# Patient Record
Sex: Female | Born: 1950 | Hispanic: No | Smoking: Never smoker
Health system: Southern US, Community
[De-identification: ages and names within clinical notes are randomized; demographics above are authoritative.]

## PROBLEM LIST (undated history)

## (undated) DIAGNOSIS — Z789 Other specified health status: Secondary | ICD-10-CM

## (undated) HISTORY — PX: BREAST SURGERY: SHX581

---

## 1977-02-09 HISTORY — PX: AUGMENTATION MAMMAPLASTY: SUR837

## 2015-12-26 DIAGNOSIS — M5441 Lumbago with sciatica, right side: Secondary | ICD-10-CM | POA: Diagnosis not present

## 2015-12-26 DIAGNOSIS — L299 Pruritus, unspecified: Secondary | ICD-10-CM | POA: Diagnosis not present

## 2015-12-26 DIAGNOSIS — M5136 Other intervertebral disc degeneration, lumbar region: Secondary | ICD-10-CM | POA: Diagnosis not present

## 2015-12-26 DIAGNOSIS — M545 Low back pain: Secondary | ICD-10-CM | POA: Diagnosis not present

## 2015-12-26 DIAGNOSIS — M25551 Pain in right hip: Secondary | ICD-10-CM | POA: Diagnosis not present

## 2015-12-26 DIAGNOSIS — G4700 Insomnia, unspecified: Secondary | ICD-10-CM | POA: Diagnosis not present

## 2015-12-26 DIAGNOSIS — R21 Rash and other nonspecific skin eruption: Secondary | ICD-10-CM | POA: Diagnosis not present

## 2015-12-26 DIAGNOSIS — E6609 Other obesity due to excess calories: Secondary | ICD-10-CM | POA: Diagnosis not present

## 2015-12-26 DIAGNOSIS — M1611 Unilateral primary osteoarthritis, right hip: Secondary | ICD-10-CM | POA: Diagnosis not present

## 2015-12-26 DIAGNOSIS — Z6831 Body mass index (BMI) 31.0-31.9, adult: Secondary | ICD-10-CM | POA: Diagnosis not present

## 2015-12-26 DIAGNOSIS — M47896 Other spondylosis, lumbar region: Secondary | ICD-10-CM | POA: Diagnosis not present

## 2015-12-26 DIAGNOSIS — R030 Elevated blood-pressure reading, without diagnosis of hypertension: Secondary | ICD-10-CM | POA: Diagnosis not present

## 2015-12-26 DIAGNOSIS — M47816 Spondylosis without myelopathy or radiculopathy, lumbar region: Secondary | ICD-10-CM | POA: Diagnosis not present

## 2015-12-26 DIAGNOSIS — M4316 Spondylolisthesis, lumbar region: Secondary | ICD-10-CM | POA: Diagnosis not present

## 2016-01-28 DIAGNOSIS — I10 Essential (primary) hypertension: Secondary | ICD-10-CM | POA: Diagnosis not present

## 2016-01-28 DIAGNOSIS — R21 Rash and other nonspecific skin eruption: Secondary | ICD-10-CM | POA: Diagnosis not present

## 2016-01-28 DIAGNOSIS — Z6832 Body mass index (BMI) 32.0-32.9, adult: Secondary | ICD-10-CM | POA: Diagnosis not present

## 2016-01-28 DIAGNOSIS — M5441 Lumbago with sciatica, right side: Secondary | ICD-10-CM | POA: Diagnosis not present

## 2016-01-28 DIAGNOSIS — M7061 Trochanteric bursitis, right hip: Secondary | ICD-10-CM | POA: Diagnosis not present

## 2016-01-28 DIAGNOSIS — E6609 Other obesity due to excess calories: Secondary | ICD-10-CM | POA: Diagnosis not present

## 2016-02-18 DIAGNOSIS — Z0000 Encounter for general adult medical examination without abnormal findings: Secondary | ICD-10-CM | POA: Diagnosis not present

## 2016-02-18 DIAGNOSIS — I10 Essential (primary) hypertension: Secondary | ICD-10-CM | POA: Diagnosis not present

## 2016-03-19 ENCOUNTER — Ambulatory Visit: Payer: Medicare Other

## 2016-03-19 ENCOUNTER — Encounter: Payer: Self-pay | Admitting: *Deleted

## 2016-03-19 ENCOUNTER — Ambulatory Visit
Admission: EM | Admit: 2016-03-19 | Discharge: 2016-03-19 | Disposition: A | Discharge status: Home / Self Care | Payer: Medicare Other | Attending: Family Medicine | Admitting: Family Medicine

## 2016-03-19 DIAGNOSIS — S99922A Unspecified injury of left foot, initial encounter: Secondary | ICD-10-CM | POA: Diagnosis not present

## 2016-03-19 DIAGNOSIS — S93692A Other sprain of left foot, initial encounter: Secondary | ICD-10-CM | POA: Insufficient documentation

## 2016-03-19 DIAGNOSIS — M79672 Pain in left foot: Secondary | ICD-10-CM | POA: Insufficient documentation

## 2016-03-19 DIAGNOSIS — S51812A Laceration without foreign body of left forearm, initial encounter: Secondary | ICD-10-CM

## 2016-03-19 DIAGNOSIS — S93602A Unspecified sprain of left foot, initial encounter: Secondary | ICD-10-CM | POA: Diagnosis not present

## 2016-03-19 MED ORDER — TETANUS-DIPHTH-ACELL PERTUSSIS 5-2.5-18.5 LF-MCG/0.5 IM SUSP: 0.5000 mL | Freq: Once | INTRAMUSCULAR | Status: DC

## 2016-03-19 MED ORDER — TETANUS-DIPHTH-ACELL PERTUSSIS 5-2.5-18.5 LF-MCG/0.5 IM SUSP
0.5000 mL | Freq: Once | INTRAMUSCULAR | Status: AC
  Administered 2016-03-19: 0.5 mL via INTRAMUSCULAR

## 2016-03-19 MED ORDER — MUPIROCIN 2 % EX OINT: 1.0000 "application " | TOPICAL_OINTMENT | Freq: Three times a day (TID) | CUTANEOUS | 0 refills | Status: DC
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
## 2016-04-22 DIAGNOSIS — M7061 Trochanteric bursitis, right hip: Secondary | ICD-10-CM | POA: Diagnosis not present

## 2016-06-02 DIAGNOSIS — M5136 Other intervertebral disc degeneration, lumbar region: Secondary | ICD-10-CM | POA: Diagnosis not present

## 2016-06-08 DIAGNOSIS — Z01812 Encounter for preprocedural laboratory examination: Secondary | ICD-10-CM | POA: Diagnosis not present

## 2016-06-09 DIAGNOSIS — M545 Low back pain: Secondary | ICD-10-CM | POA: Diagnosis not present

## 2016-06-10 DIAGNOSIS — M4316 Spondylolisthesis, lumbar region: Secondary | ICD-10-CM | POA: Diagnosis not present

## 2016-06-10 DIAGNOSIS — M4726 Other spondylosis with radiculopathy, lumbar region: Secondary | ICD-10-CM | POA: Diagnosis not present

## 2016-06-10 DIAGNOSIS — M5126 Other intervertebral disc displacement, lumbar region: Secondary | ICD-10-CM | POA: Diagnosis not present

## 2016-06-10 DIAGNOSIS — M47816 Spondylosis without myelopathy or radiculopathy, lumbar region: Secondary | ICD-10-CM | POA: Diagnosis not present

## 2017-03-24 ENCOUNTER — Other Ambulatory Visit: Payer: Self-pay

## 2017-03-24 ENCOUNTER — Ambulatory Visit: Payer: Medicare Other

## 2017-03-24 ENCOUNTER — Ambulatory Visit
Admission: EM | Admit: 2017-03-24 | Discharge: 2017-03-24 | Disposition: A | Discharge status: Home / Self Care | Payer: Medicare Other | Attending: Family Medicine | Admitting: Family Medicine

## 2017-03-24 ENCOUNTER — Encounter: Payer: Self-pay | Admitting: Emergency Medicine

## 2017-03-24 DIAGNOSIS — S61221A Laceration with foreign body of left index finger without damage to nail, initial encounter: Secondary | ICD-10-CM | POA: Diagnosis not present

## 2017-03-24 DIAGNOSIS — S61412A Laceration without foreign body of left hand, initial encounter: Secondary | ICD-10-CM | POA: Diagnosis not present

## 2017-03-24 DIAGNOSIS — W272XXA Contact with scissors, initial encounter: Secondary | ICD-10-CM

## 2017-03-24 HISTORY — DX: Other specified health status: Z78.9

## 2017-03-24 MED ORDER — CEPHALEXIN 500 MG PO CAPS: 500.0000 mg | ORAL_CAPSULE | Freq: Three times a day (TID) | ORAL | 0 refills | Status: AC

## 2017-03-24 MED ORDER — MUPIROCIN 2 % EX OINT: TOPICAL_OINTMENT | CUTANEOUS | 0 refills | Status: DC

## 2017-03-24 MED ORDER — LIDOCAINE HCL (PF) 1 % IJ SOLN: 10.0000 mL | Freq: Once | INTRAMUSCULAR | Status: DC

## 2017-03-24 NOTE — ED Triage Notes (Signed)
Patient was trying to cut a zip tie this morning and the scissors slipped and lacerated her left hand between thumb and pointer finger. Patient's last tetanus was 2017.

## 2017-03-24 NOTE — Discharge Instructions (Signed)
Take medication as prescribed. Rest. Drink plenty of fluids. Keep clean as discussed, and use splint.  Return to Urgent care in 10 days for suture removal. Return to Urgent care in 3 days for wound recheck.   Follow up with your primary care physician this week as needed. Return to Urgent care for new or worsening concerns.

## 2017-03-24 NOTE — ED Provider Notes (Signed)
[facility] ____________________________________________  Time seen: Approximately 8:49 AM  I have reviewed the triage vital signs and the nursing notes.   HISTORY  Chief Complaint Laceration (left hand)   HPI Angela Ponce is a 67 y.o. female presenting with husband at bedside for evaluation of left hand laceration that occurred just prior to arrival at home.  Patient reports that she was, trying to cut away a zip tie that was very tight, and reports in the process the scissors slipped and went to the thumb webbing of the left hand causing laceration.  Reports last tetanus immunization was 2 years ago.  Denies concern of foreign body.  Denies pain with movement.  States only mild pain directly at laceration site.  Denies head injury, loss of conscious or other complaints.  Reports right-hand dominant. Denies recent sickness. Denies recent antibiotic use.     Past Medical History:  Diagnosis Date  . No known health problems     There are no active problems to display for this patient.   History reviewed. No pertinent surgical history.    Current Facility-Administered Medications:  .  lidocaine (PF) (XYLOCAINE) 1 % injection 10 mL, 10 mL, Other, Once, [name], NP  Current Outpatient Medications:  .  cephALEXin (KEFLEX) 500 MG capsule, Take 1 capsule (500 mg total) by mouth 3 (three) times daily for 7 days., Disp: 21 capsule, Rfl: 0 .  mupirocin ointment (BACTROBAN) 2 %, Apply two times a day for 7 days., Disp: 22 g, Rfl: 0  Allergies Patient has no known allergies.  Family History  Problem Relation Age of Onset  . Bladder Cancer Mother   . Diabetes Mother   . Hypertension Mother     Social History Social History   Tobacco Use  . Smoking status: Never Smoker  . Smokeless tobacco: Never Used  Substance Use Topics  . Alcohol use: No  . Drug use: No    Review of Systems Constitutional: No fever/chills Cardiovascular: Denies chest  pain. Respiratory: Denies shortness of breath. Skin: As above.  ____________________________________________   PHYSICAL EXAM:  VITAL SIGNS: ED Triage Vitals  Enc Vitals Group     BP 03/24/17 0827 (!) 159/87     Pulse Rate 03/24/17 0827 72     Resp 03/24/17 0827 20     Temp 03/24/17 0827 97.8 F (36.6 C)     Temp Source 03/24/17 0827 Oral     SpO2 03/24/17 0827 100 %     Weight 03/24/17 0828 180 lb (81.6 kg)     Height 03/24/17 0828 5\' 5"  (1.651 m)     Head Circumference --      Peak Flow --      Pain Score 03/24/17 0828 3     Pain Loc --      Pain Edu? --      Excl. in [place]? --     Constitutional: Alert and oriented. Well appearing and in no acute distress. Cardiovascular: Normal rate, regular rhythm. Grossly normal heart sounds.  Good peripheral circulation. Respiratory: Normal respiratory effort without tachypnea nor retractions. Breath sounds are clear and equal bilaterally. No wheezes, rales, rhonchi. Musculoskeletal:  Steady gait. Neurologic:  Normal speech and language. Speech is normal. No gait instability.  Skin:  Skin is warm, dry.  Except: left hand laceration, see images.      6.5cm laceration left web spacing between 1 and 2 fingers, active continued bleeding without clear arterial site, no tendon or bone visualized, full  range of motion to left hand, no motor of tendon deficits noted, normal distal sensation and capillary refill to left hand fingers, no bony tenderness.  Psychiatric: Mood and affect are normal. Speech and behavior are normal. Patient exhibits appropriate insight and judgment   ___________________________________________   LABS (all labs ordered are listed, but only abnormal results are displayed)  Labs Reviewed - No data to display ____________________________________________   PROCEDURES Procedures   Procedure(s) performed:  Procedure explained and verbal consent obtained. Consent: Verbal consent obtained. Written consent not  obtained. Risks and benefits: risks, benefits and alternatives were discussed Patient identity confirmed: verbally with patient and hospital-assigned identification number  Consent given by: patient   Laceration Repair Location: left hand Length: 6.5 cm Foreign bodies: no foreign bodies Tendon involvement: none Nerve involvement: none Preparation: Patient was prepped and draped in the usual sterile fashion. Anesthesia with 1% Lidocaine 10 mls Irrigation solution: saline and betadine Irrigation method: jet lavage Amount of cleaning: copious Continued bleeding present and stopped with direct pressure and silver nitrate use. Repaired with 5-0 vicryl x 5, and 4-0 nylon x 11 (image above) Technique: simple interrupted  Approximation: loose Patient tolerate well. Wound well approximated post repair.  Antibiotic ointment and dressing applied.  Wound care instructions provided.  Observe for any signs of infection or other problems.    Radiology  EXAM: LEFT HAND - COMPLETE 3+ VIEW  COMPARISON:  None.  FINDINGS: No acute bony or joint abnormality is identified. Radiopaque density projecting in the web space between the thumb and index finger is likely related to suturing and bandaging. 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 Scattered mild degenerative change about the DIP joints is also noted.  IMPRESSION: Radiopaque density projecting in the webspace between the thumb and index finger is presumably related to treatment for laceration. No radiopaque foreign body is at its worse that no unexpected radiopaque foreign body is identified.  Negative for fracture.  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   Electronically Signed   By: Inge Rise M.D.   On: 03/24/2017 10:22  INITIAL IMPRESSION / ASSESSMENT AND PLAN / ED COURSE  Pertinent labs & imaging results that were available during my care of the patient were reviewed by  me and considered in my medical decision making (see chart for details).  Well-appearing patient.  No acute distress.  Large laceration to left hand.  No bony tenderness, full range of motion present, patient declined x-ray.  Laceration repaired as above.  After x-ray, patient reports some diffuse tenderness and request x-ray to be completed to ensure no fracture.  Left hand x-ray completed as above.  Ulnar gutter splint Velcro also given the next 2-3 days for support.  Encouraged rest, elevation, supportive care.  Will place patient on Keflex and topical Bactroban.  Encouraged wound check in 3 days.  Also suture removal in 10 days.  Discussed strict follow-up and return parameters.Discussed indication, risks and benefits of medications with patient.   Discussed follow up and return parameters including no resolution or any worsening concerns. Patient verbalized understanding and agreed to plan.   ____________________________________________   FINAL CLINICAL IMPRESSION(S) / ED DIAGNOSES  Final diagnoses:  Laceration of left hand without foreign body, initial encounter     ED Discharge Orders        Ordered    cephALEXin (KEFLEX) 500 MG capsule  3 times daily     03/24/17 1034    mupirocin ointment (BACTROBAN) 2 %  03/24/17 1034       Note: This dictation was prepared with Dragon dictation along with smaller phrase technology. Any transcriptional errors that result from this process are unintentional.         [name], NP 03/24/17 1145

## 2017-04-05 ENCOUNTER — Other Ambulatory Visit: Payer: Self-pay

## 2017-04-05 ENCOUNTER — Ambulatory Visit
Admission: EM | Admit: 2017-04-05 | Discharge: 2017-04-05 | Disposition: A | Discharge status: Home / Self Care | Payer: Medicare Other | Attending: Family Medicine | Admitting: Family Medicine

## 2017-04-05 ENCOUNTER — Encounter: Payer: Self-pay | Admitting: Emergency Medicine

## 2017-04-05 DIAGNOSIS — S61412D Laceration without foreign body of left hand, subsequent encounter: Secondary | ICD-10-CM | POA: Diagnosis not present

## 2017-04-05 DIAGNOSIS — Z5189 Encounter for other specified aftercare: Secondary | ICD-10-CM

## 2017-04-05 MED ORDER — CEPHALEXIN 500 MG PO CAPS: 500.0000 mg | ORAL_CAPSULE | Freq: Three times a day (TID) | ORAL | 0 refills | Status: AC

## 2017-04-05 NOTE — ED Provider Notes (Addendum)
[facility] ____________________________________________  Time seen: Approximately 1:33 PM  I have reviewed the triage vital signs and the nursing notes.   HISTORY  Chief Complaint Suture / Staple Removal   HPI Angela Ponce is a 67 y.o. female presenting for wound check as well as evaluation for suture removal.  Patient was seen in urgent care 12 days ago for laceration present to spacing between the first and second fingers of left hand post accidentally cutting self with scissors.  Patient had sustained a deep wound that required subcutaneous as well as external sutures and cautery to stop bleeding.  Patient reports that she feels that the area is healing well, but unsure if sutures should be taken at this time.  States occasionally she notices a small amount of clearish to brownish drainage after using her hand a lot, but otherwise denies drainage.  Denies any associated pain to the hand, paresthesias, decreased strength or decreased range of motion. Denies bleeding at home.  Has continue to rest the area.  Finished her antibiotic last week and has since stopped applying topical antibiotics as well.  Right-hand-dominant.  Reports otherwise feels well denies other complaints.  No fevers.  Tetanus immunization was up-to-date.  Past Medical History:  Diagnosis Date  . No known health problems     There are no active problems to display for this patient.   History reviewed. No pertinent surgical history.   No current facility-administered medications for this encounter.   Current Outpatient Medications:  .  cephALEXin (KEFLEX) 500 MG capsule, Take 1 capsule (500 mg total) by mouth 3 (three) times daily for 7 days., Disp: 21 capsule, Rfl: 0 .  mupirocin ointment (BACTROBAN) 2 %, Apply two times a day for 7 days., Disp: 22 g, Rfl: 0  Allergies Patient has no known allergies.  Family History  Problem Relation Age of Onset  . Bladder Cancer Mother   . Diabetes Mother    . Hypertension Mother     Social History Social History   Tobacco Use  . Smoking status: Never Smoker  . Smokeless tobacco: Never Used  Substance Use Topics  . Alcohol use: No  . Drug use: No    Review of Systems Constitutional: No fever/chills Cardiovascular: Denies chest pain. Respiratory: Denies shortness of breath. Skin:As above.  ____________________________________________   PHYSICAL EXAM:  VITAL SIGNS: ED Triage Vitals  Enc Vitals Group     BP 04/05/17 1253 (!) 159/79     Pulse Rate 04/05/17 1253 77     Resp 04/05/17 1253 18     Temp 04/05/17 1253 (!) 97.5 F (36.4 C)     Temp Source 04/05/17 1253 Oral     SpO2 04/05/17 1257 100 %     Weight 04/05/17 1257 180 lb (81.6 kg)     Height 04/05/17 1257 5\' 5"  (1.651 m)     Head Circumference --      Peak Flow --      Pain Score 04/05/17 1256 0     Pain Loc --      Pain Edu? --      Excl. in [place]? --     Constitutional: Alert and oriented. Well appearing and in no acute distress. Cardiovascular: Normal rate, regular rhythm. Grossly normal heart sounds.  Good peripheral circulation. Respiratory: Normal respiratory effort without tachypnea nor retractions. Breath sounds are clear and equal bilaterally. No wheezes, rales, rhonchi. Musculoskeletal: Steady gait.  Left hand nontender and with full range of motion present,  no motor or tendon deficit noted, normal distal sensation and capillary refill. Neurologic:  Normal speech and language. No gross focal neurologic deficits are appreciated. Speech is normal. No gait instability.  Skin:  Skin is warm, dry.  Except: Left hand mid thumb and second finger web spacing healing laceration present with times 11 sutures present, minimal amount of clear to yellowish fluid expressed with palpation, sutures examined for adherent approximation and with slight dehiscence noted at proximal and distal laceration site with resisted traction, otherwise no dehiscence, no surrounding  erythema, swelling, tenderness. Psychiatric: Mood and affect are normal. Speech and behavior are normal. Patient exhibits appropriate insight and judgment   ___________________________________________   LABS (all labs ordered are listed, but only abnormal results are displayed)  Labs Reviewed - No data to display ____________________________________________  PROCEDURES Procedures   INITIAL IMPRESSION / ASSESSMENT AND PLAN / ED COURSE  Pertinent labs & imaging results that were available during my care of the patient were reviewed by me and considered in my medical decision making (see chart for details).  Well-appearing patient.  No acute distress.  Wound recheck and evaluation for suture removal.  The patient has sustained a deep wound that appears to be healing well overall.  Suspect drainage related to inflammatory overuse, however some concern for secondary infection and will restart cephalexin.  Discussed in detail that superficial dehiscence may occur, however we discussed leave the sutures in for 4 more days while starting antibiotic and resuming topical antibiotic and return for suture removal.  Discussed may need to use Steri-Strips for support for further healing.  Patient agreed to this plan.Discussed indication, risks and benefits of medications with patient.  Discussed follow up and return parameters including no resolution or any worsening concerns. Patient verbalized understanding and agreed to plan.   ____________________________________________   FINAL CLINICAL IMPRESSION(S) / ED DIAGNOSES  Final diagnoses:  Visit for wound check     ED Discharge Orders        Ordered    cephALEXin (KEFLEX) 500 MG capsule  3 times daily     04/05/17 1334       Note: This dictation was prepared with Dragon dictation along with smaller phrase technology. Any transcriptional errors that result from this process are unintentional.         [name], NP 04/05/17  [phone]

## 2017-04-05 NOTE — Discharge Instructions (Signed)
Take medication as prescribed. Wound care discussed. Return in 4 days for wound recheck and suture removal.  Follow up with your primary care physician this week as needed. Return to Urgent care for new or worsening concerns.

## 2017-04-05 NOTE — ED Triage Notes (Signed)
Patient here for suture removal. Patient states she is having some drainage from the wound and would like a provider to assess it prior to removal of sutures.

## 2017-04-09 ENCOUNTER — Other Ambulatory Visit: Payer: Self-pay

## 2017-04-09 ENCOUNTER — Ambulatory Visit
Admission: EM | Admit: 2017-04-09 | Discharge: 2017-04-09 | Disposition: A | Discharge status: Home / Self Care | Payer: Medicare Other | Attending: Family Medicine | Admitting: Family Medicine

## 2017-04-09 ENCOUNTER — Encounter: Payer: Self-pay | Admitting: Emergency Medicine

## 2017-04-09 DIAGNOSIS — T8130XA Disruption of wound, unspecified, initial encounter: Secondary | ICD-10-CM

## 2017-04-09 DIAGNOSIS — S61412D Laceration without foreign body of left hand, subsequent encounter: Secondary | ICD-10-CM

## 2017-04-09 DIAGNOSIS — Z4802 Encounter for removal of sutures: Secondary | ICD-10-CM | POA: Diagnosis not present

## 2017-04-09 NOTE — ED Triage Notes (Signed)
Patient in today for suture removal. Laceration is clean, dry and well healed.

## 2017-04-09 NOTE — Discharge Instructions (Signed)
Take medication as prescribed. Keep clean.  ° °Follow up with your primary care physician this week as needed. Return to Urgent care for new or worsening concerns.  ° °

## 2017-04-09 NOTE — ED Provider Notes (Signed)
[facility] ____________________________________________  Time seen: Approximately 1150 PM  I have reviewed the triage vital signs and the nursing notes.   HISTORY  Chief Complaint Suture / Staple Removal (APPT)   HPI Angela Ponce is a 67 y.o. female presenting for reevaluation of left hand sutures.  Patient was initially seen in urgent care on 03/24/17 after sustaining a laceration to space in between left thumb and left index finger that required extensive repair, and then returned on 225 for suture removal.  It was found that area needed longer due to concern of dehiscence as well as possible concern of secondary infection and was restarted on oral Keflex.  Patient states that she has been taking oral Keflex as well as topical Bactroban as prescribed.  States the area feels like it is continued to improve to her.  States no pain associated to laceration area.  Denies any recent drainage, paresthesias, pain radiation, decreased strength, decreased movement or fevers.  Reports doing well.  Presenting for suture removal and wound check.  Tetanus immunization is up-to-date.    Past Medical History:  Diagnosis Date  . No known health problems     There are no active problems to display for this patient.   History reviewed. No pertinent surgical history.   No current facility-administered medications for this encounter.   Current Outpatient Medications:  .  cephALEXin (KEFLEX) 500 MG capsule, Take 1 capsule (500 mg total) by mouth 3 (three) times daily for 7 days., Disp: 21 capsule, Rfl: 0 .  mupirocin ointment (BACTROBAN) 2 %, Apply two times a day for 7 days., Disp: 22 g, Rfl: 0  Allergies Patient has no known allergies.  Family History  Problem Relation Age of Onset  . Bladder Cancer Mother   . Diabetes Mother   . Hypertension Mother     Social History Social History   Tobacco Use  . Smoking status: Never Smoker  . Smokeless tobacco: Never Used    Substance Use Topics  . Alcohol use: No  . Drug use: No    Review of Systems Constitutional: No fever/chills Cardiovascular: Denies chest pain. Respiratory: Denies shortness of breath. Gastrointestinal: No abdominal pain. Musculoskeletal: Negative for back pain. Skin: As above.    ____________________________________________   PHYSICAL EXAM:  VITAL SIGNS: ED Triage Vitals  Enc Vitals Group     BP 04/09/17 0951 (!) 148/79     Pulse Rate 04/09/17 0951 71     Resp 04/09/17 0951 16     Temp 04/09/17 0951 97.9 F (36.6 C)     Temp Source 04/09/17 0951 Oral     SpO2 04/09/17 0951 99 %     Weight 04/09/17 0952 180 lb (81.6 kg)     Height 04/09/17 0952 5\' 5"  (1.651 m)     Head Circumference --      Peak Flow --      Pain Score 04/09/17 0951 0     Pain Loc --      Pain Edu? --      Excl. in [place]? --     Constitutional: Alert and oriented. Well appearing and in no acute distress. Cardiovascular: Normal rate, regular rhythm. Grossly normal heart sounds.  Good peripheral circulation. Respiratory: Normal respiratory effort without tachypnea nor retractions. Breath sounds are clear and equal bilaterally. No wheezes, rales, rhonchi. Musculoskeletal: Steady gait.  Neurologic:  Normal speech and language. No gross focal neurologic deficits are appreciated. Speech is normal. No gait instability.  Skin:  Skin is warm, dry. Except: Left hand webspace between first and second fingers times 11 sutures present, no surrounding erythema, nontender, no drainage, mild appearance of some dehiscence at suture sites, left hand nontender with no motor or tendon deficits, full range of motion present with good strength. Psychiatric: Mood and affect are normal. Speech and behavior are normal. Patient exhibits appropriate insight and judgment   ___________________________________________   LABS (all labs ordered are listed, but only abnormal results are displayed)  Labs Reviewed - No data to  display   PROCEDURES Procedures   Procedure(s) performed:  Procedure explained and verbal consent obtained. Consent: Verbal consent obtained. Written consent not obtained. Risks and benefits: risks, benefits and alternatives were discussed Patient identity confirmed: verbally with patient and hospital-assigned identification number  Consent given by: patient   Suture removal Location: Left hand 11 sutures removed by myself.  There is noted to be mild proximal dehiscence, no drainage, no erythema, no appearance of secondary infection.  As sutures is been present for 2 weeks recommend for suture removal.  All external sutures removed.  Skin cleaned and prepped, x2 Steri-Strips applied for supportive care.  Remaining Steri-Strips sent home with patient and directed in care at home as well as wound care. Wound care instructions provided.  Observe for any signs of infection or other problems.      INITIAL IMPRESSION / ASSESSMENT AND PLAN / ED COURSE  Pertinent labs & imaging results that were available during my care of the patient were reviewed by me and considered in my medical decision making (see chart for details).  Well-appearing patient.  No acute distress.  Presenting for evaluation of wound check as well as suture removal.  Noted wound likely still do his prior to suture removal, but due to timing of sutures being present recommend for suture removal.  Mild dehiscence noted, Steri-Strips applied for support.  Directed to continue with Steri-Strips at home.  Continue home antibiotic therapy and discussed strict follow-up and return parameters.  Discussed follow up with Primary care physician this week. Discussed follow up and return parameters including no resolution or any worsening concerns. Patient verbalized understanding and agreed to plan.   ____________________________________________   FINAL CLINICAL IMPRESSION(S) / ED DIAGNOSES  Final diagnoses:  Visit for suture  removal  Dehiscence of laceration wound, initial encounter     ED Discharge Orders    None       Note: This dictation was prepared with Dragon dictation along with smaller phrase technology. Any transcriptional errors that result from this process are unintentional.         [name], NP 04/09/17 1300

## 2017-04-13 ENCOUNTER — Encounter: Payer: Self-pay | Admitting: *Deleted

## 2017-04-13 ENCOUNTER — Ambulatory Visit
Admission: EM | Admit: 2017-04-13 | Discharge: 2017-04-13 | Disposition: A | Discharge status: Home / Self Care | Payer: Medicare Other | Attending: Family Medicine | Admitting: Family Medicine

## 2017-04-13 DIAGNOSIS — Z5189 Encounter for other specified aftercare: Secondary | ICD-10-CM

## 2017-04-13 DIAGNOSIS — S61412D Laceration without foreign body of left hand, subsequent encounter: Secondary | ICD-10-CM

## 2017-04-13 DIAGNOSIS — T148XXA Other injury of unspecified body region, initial encounter: Secondary | ICD-10-CM

## 2017-04-13 NOTE — Discharge Instructions (Signed)
Keep clean as discussed. Rest.   Follow up with your primary care physician this week as needed. Return to Urgent care for new or worsening concerns.

## 2017-04-13 NOTE — ED Provider Notes (Signed)
[facility] ____________________________________________  Time seen: Approximately 175 PM  I have reviewed the triage vital signs and the nursing notes.   HISTORY  Chief Complaint Wound Check   HPI Angela Ponce is a 67 y.o. female patient presenting today again for left hand wound check after recent laceration and suture removal.  Patient reports Steri-Strips that were placed at last visit were helping, but reports she drove the car the other day and felt like this because the Steri-Strips to come off.  Patient denies any pain, paresthesias, decreased strength, decreased range of motion, fevers, redness or other complaints.  Patient states she just wanted to have the area rechecked to make sure it was okay.  Denies complaints.  States finished her antibiotic oral today.  Continues to keep clean.   Past Medical History:  Diagnosis Date  . No known health problems     There are no active problems to display for this patient.   History reviewed. No pertinent surgical history.   No current facility-administered medications for this encounter.   Current Outpatient Medications:  .  mupirocin ointment (BACTROBAN) 2 %, Apply two times a day for 7 days., Disp: 22 g, Rfl: 0  Allergies Patient has no known allergies.  Family History  Problem Relation Age of Onset  . Bladder Cancer Mother   . Diabetes Mother   . Hypertension Mother     Social History Social History   Tobacco Use  . Smoking status: Never Smoker  . Smokeless tobacco: Never Used  Substance Use Topics  . Alcohol use: No  . Drug use: No    Review of Systems Constitutional: No fever/chills Cardiovascular: Denies chest pain. Respiratory: Denies shortness of breath. Skin: AS above.  ____________________________________________   PHYSICAL EXAM:  VITAL SIGNS: ED Triage Vitals  Enc Vitals Group     BP 04/13/17 1616 (!) 158/78     Pulse Rate 04/13/17 1616 83     Resp 04/13/17 1616 16   Temp 04/13/17 1616 97.6 F (36.4 C)     Temp Source 04/13/17 1616 Oral     SpO2 04/13/17 1616 99 %     Weight 04/13/17 1614 180 lb (81.6 kg)     Height 04/13/17 1614 5\' 5"  (1.651 m)     Head Circumference --      Peak Flow --      Pain Score 04/13/17 1614 0     Pain Loc --      Pain Edu? --      Excl. in [place]? --     Constitutional: Alert and oriented. Well appearing and in no acute distress. Cardiovascular:  Good peripheral circulation. Respiratory: Normal respiratory effort without tachypnea nor retractions.  Musculoskeletal:Bilateral distal radial pulses equal and easily palpated.  Bilateral hand grip strong and equal.  See skin below. Neurologic:  Normal speech and language.  Speech is normal. No gait instability.  Skin:  Skin is warm, dry .  Except:   Noted dehiscence as above at recent laceration repair site.  Wound and hand nontender with good strength, no motor or tendon deficit, no paresthesias, normal distal sensation and capillary refill.  Wound no surrounding erythema, drainage, mild induration. Psychiatric: Mood and affect are normal. Speech and behavior are normal. Patient exhibits appropriate insight and judgment   ___________________________________________   LABS (all labs ordered are listed, but only abnormal results are displayed)  Labs Reviewed - No data to display  PROCEDURES Procedures  Procedure explained and verbal consent obtained  for wound cleaning and Steri-Strip. Left hand wound clean Betadine and saline.  Patient tolerated well.  X2 Steri-Strips applied and wound well approximated.  Dressing applied.  INITIAL IMPRESSION / ASSESSMENT AND PLAN / ED COURSE  Pertinent labs & imaging results that were available during my care of the patient were reviewed by me and considered in my medical decision making (see chart for details).  Well-appearing patient.  No acute distress.  Follow-up for wound check to left hand.  Area does not appear to have a  secondary infection, noted to have dehiscence with thickened scabbing.  Discussed continued use of Steri-Strips, clean and supportive care.  Discussed strict follow-up and return parameters.  Discussed follow up and return parameters including no resolution or any worsening concerns. Patient verbalized understanding and agreed to plan.   ____________________________________________   FINAL CLINICAL IMPRESSION(S) / ED DIAGNOSES  Final diagnoses:  Visit for wound check     ED Discharge Orders    None       Note: This dictation was prepared with Dragon dictation along with smaller phrase technology. Any transcriptional errors that result from this process are unintentional.         [name], NP 04/13/17 Vernelle Emerald

## 2017-04-13 NOTE — ED Triage Notes (Signed)
Pt is here for a follow up visit. Left hand wound. Pt had stiches removed last Friday, but was told to come see [place] today for a follow up

## 2017-06-23 DIAGNOSIS — D239 Other benign neoplasm of skin, unspecified: Secondary | ICD-10-CM | POA: Diagnosis not present

## 2017-06-23 DIAGNOSIS — L280 Lichen simplex chronicus: Secondary | ICD-10-CM | POA: Diagnosis not present

## 2018-04-15 ENCOUNTER — Ambulatory Visit: Payer: Medicare Other

## 2018-04-15 ENCOUNTER — Other Ambulatory Visit: Payer: Self-pay

## 2018-04-15 ENCOUNTER — Ambulatory Visit
Admission: EM | Admit: 2018-04-15 | Discharge: 2018-04-15 | Disposition: A | Discharge status: Home / Self Care | Payer: Medicare Other | Attending: Family Medicine | Admitting: Family Medicine

## 2018-04-15 ENCOUNTER — Encounter: Payer: Self-pay | Admitting: Emergency Medicine

## 2018-04-15 DIAGNOSIS — J069 Acute upper respiratory infection, unspecified: Secondary | ICD-10-CM | POA: Diagnosis not present

## 2018-04-15 DIAGNOSIS — R05 Cough: Secondary | ICD-10-CM | POA: Diagnosis not present

## 2018-04-15 DIAGNOSIS — B9789 Other viral agents as the cause of diseases classified elsewhere: Secondary | ICD-10-CM | POA: Diagnosis not present

## 2018-04-15 DIAGNOSIS — M7671 Peroneal tendinitis, right leg: Secondary | ICD-10-CM | POA: Diagnosis not present

## 2018-04-15 DIAGNOSIS — M79671 Pain in right foot: Secondary | ICD-10-CM | POA: Diagnosis not present

## 2018-04-15 DIAGNOSIS — M7731 Calcaneal spur, right foot: Secondary | ICD-10-CM | POA: Diagnosis not present

## 2018-04-15 DIAGNOSIS — R062 Wheezing: Secondary | ICD-10-CM | POA: Diagnosis not present

## 2018-04-15 DIAGNOSIS — M722 Plantar fascial fibromatosis: Secondary | ICD-10-CM | POA: Diagnosis not present

## 2018-04-15 LAB — RAPID INFLUENZA A&B ANTIGENS: Influenza B (ARMC): NEGATIVE

## 2018-04-15 LAB — RAPID INFLUENZA A&B ANTIGENS (ARMC ONLY): INFLUENZA A (ARMC): NEGATIVE

## 2018-04-15 MED ORDER — BENZONATATE 200 MG PO CAPS: 200.0000 mg | ORAL_CAPSULE | Freq: Three times a day (TID) | ORAL | 0 refills | Status: DC | PRN

## 2018-04-15 MED ORDER — ALBUTEROL SULFATE HFA 108 (90 BASE) MCG/ACT IN AERS: 1.0000 | INHALATION_SPRAY | Freq: Four times a day (QID) | RESPIRATORY_TRACT | 0 refills | Status: DC | PRN

## 2018-04-15 NOTE — Discharge Instructions (Signed)
Rest, fluids, over the counter cold/cough medication as needed

## 2018-04-15 NOTE — ED Triage Notes (Signed)
Patient c/o cough and SOB that started on Wed.  Patient states that she was 2 weeks in [place], [place] and arrived back from [place] on Tuesday night.  Patient states that she has not been around anyone sick.  Patient denies fevers.

## 2018-04-15 NOTE — ED Provider Notes (Signed)
[facility]    CSN: [id number] Arrival date & time: 04/15/18  1051     History   Chief Complaint Chief Complaint  Patient presents with  . Cough    HPI Angela Ponce is a 68 y.o. female.   68 yo otherwise healthy female presents with a c/o cough and shortness of breath for the past 2 days. Denies any fevers, chills, chest pains. States she was in [place] for 2 weeks and arrived back on Tuesday (4 days ago). Denies any known exposure to coronavirus affected or positive persons. States her brother who was also there with her was recently diagnosed with pneumonia.   The history is provided by the patient.  Cough    Past Medical History:  Diagnosis Date  . No known health problems     There are no active problems to display for this patient.   History reviewed. No pertinent surgical history.  OB History   No obstetric history on file.      Home Medications    Prior to Admission medications   Medication Sig Start Date End Date Taking? Authorizing Provider  albuterol (PROVENTIL HFA;VENTOLIN HFA) 108 (90 Base) MCG/ACT inhaler Inhale 1-2 puffs into the lungs every 6 (six) hours as needed for wheezing or shortness of breath. 04/15/18   Norval Gable, MD  benzonatate (TESSALON) 200 MG capsule Take 1 capsule (200 mg total) by mouth 3 (three) times daily as needed. 04/15/18   Norval Gable, MD  mupirocin ointment (BACTROBAN) 2 % Apply two times a day for 7 days. 03/24/17   [name], NP    Family History Family History  Problem Relation Age of Onset  . Bladder Cancer Mother   . Diabetes Mother   . Hypertension Mother     Social History Social History   Tobacco Use  . Smoking status: Never Smoker  . Smokeless tobacco: Never Used  Substance Use Topics  . Alcohol use: No  . Drug use: No     Allergies   Patient has no known allergies.   Review of Systems Review of Systems  Respiratory: Positive for cough.      Physical Exam Triage Vital  Signs ED Triage Vitals  Enc Vitals Group     BP 04/15/18 1106 (!) 167/88     Pulse Rate 04/15/18 1106 79     Resp 04/15/18 1106 16     Temp 04/15/18 1106 97.8 F (36.6 C)     Temp Source 04/15/18 1106 Oral     SpO2 04/15/18 1106 97 %     Weight 04/15/18 1103 185 lb (83.9 kg)     Height 04/15/18 1103 5' 5.5" (1.664 m)     Head Circumference --      Peak Flow --      Pain Score 04/15/18 1103 0     Pain Loc --      Pain Edu? --      Excl. in [place]? --    No data found.  Updated Vital Signs BP (!) 167/88 (BP Location: Left Arm)   Pulse 79   Temp 97.8 F (36.6 C) (Oral)   Resp 16   Ht 5' 5.5" (1.664 m)   Wt 83.9 kg   SpO2 97%   BMI 30.32 kg/m   Visual Acuity Right Eye Distance:   Left Eye Distance:   Bilateral Distance:    Right Eye Near:   Left Eye Near:    Bilateral Near:  Physical Exam Vitals signs and nursing note reviewed.  Constitutional:      General: She is not in acute distress.    Appearance: She is not ill-appearing or toxic-appearing.  HENT:     Nose: Congestion and rhinorrhea present.  Eyes:     General:        Right eye: No discharge.        Left eye: No discharge.  Cardiovascular:     Rate and Rhythm: Normal rate and regular rhythm.     Pulses: Normal pulses.  Pulmonary:     Effort: Pulmonary effort is normal. No respiratory distress.     Breath sounds: No stridor. Wheezing and rhonchi present. No rales.  Chest:     Chest wall: Tenderness: right side.  Neurological:     Mental Status: She is alert.      UC Treatments / Results  Labs (all labs ordered are listed, but only abnormal results are displayed) Labs Reviewed  RAPID INFLUENZA A&B ANTIGENS ([place])    EKG None  Radiology Dg Chest 2 View  Result Date: 04/15/2018 CLINICAL DATA:  Upper chest tightness and wheezing with nonproductive cough. Recent travel to [facility]. EXAM: CHEST - 2 VIEW COMPARISON:  None. FINDINGS: Cardiomediastinal silhouette is normal.  Mediastinal contours appear intact. There is no evidence of focal airspace consolidation, pleural effusion or pneumothorax. Osseous structures are without acute abnormality. Soft tissues are grossly normal. IMPRESSION: No active cardiopulmonary disease. Electronically Signed   By: Fidela Salisbury M.D.   On: 04/15/2018 12:16    Procedures Procedures (including critical care time)  Medications Ordered in UC Medications - No data to display  Initial Impression / Assessment and Plan / UC Course  I have reviewed the triage vital signs and the nursing notes.  Pertinent labs & imaging results that were available during my care of the patient were reviewed by me and considered in my medical decision making (see chart for details).      Final Clinical Impressions(s) / UC Diagnoses   Final diagnoses:  Viral URI with cough  Wheezing     Discharge Instructions     Rest, fluids, over the counter cold/cough medication as needed    ED Prescriptions    Medication Sig Dispense Auth. Provider   albuterol (PROVENTIL HFA;VENTOLIN HFA) 108 (90 Base) MCG/ACT inhaler Inhale 1-2 puffs into the lungs every 6 (six) hours as needed for wheezing or shortness of breath. 1 Inhaler Kayloni Rocco, Linward Foster, MD   benzonatate (TESSALON) 200 MG capsule Take 1 capsule (200 mg total) by mouth 3 (three) times daily as needed. 30 capsule Norval Gable, MD     1. Labs/x-ray results and diagnosis reviewed with patient 2. rx as per orders above; reviewed possible side effects, interactions, risks and benefits  3. Recommend supportive treatment as above 4. Follow-up prn if symptoms worsen or don't improve    Controlled Substance Prescriptions [place] Controlled Substance Registry consulted? Not Applicable   Norval Gable, MD 04/15/18 [id number]

## 2018-05-06 DIAGNOSIS — M7751 Other enthesopathy of right foot: Secondary | ICD-10-CM | POA: Diagnosis not present

## 2018-05-06 DIAGNOSIS — M722 Plantar fascial fibromatosis: Secondary | ICD-10-CM | POA: Diagnosis not present

## 2018-05-19 ENCOUNTER — Ambulatory Visit: Payer: Self-pay | Admitting: Family Medicine

## 2018-08-16 ENCOUNTER — Encounter

## 2018-08-16 ENCOUNTER — Other Ambulatory Visit: Payer: Self-pay

## 2018-08-16 ENCOUNTER — Telehealth: Payer: Self-pay

## 2018-08-16 ENCOUNTER — Ambulatory Visit (INDEPENDENT_AMBULATORY_CARE_PROVIDER_SITE_OTHER): Payer: Medicare Other | Admitting: Family Medicine

## 2018-08-16 ENCOUNTER — Encounter: Payer: Self-pay | Admitting: Family Medicine

## 2018-08-16 VITALS — BP 132/84 | HR 72 | Temp 97.1°F | Resp 16 | Ht 65.0 in | Wt 188.6 lb

## 2018-08-16 DIAGNOSIS — Z1212 Encounter for screening for malignant neoplasm of rectum: Secondary | ICD-10-CM | POA: Diagnosis not present

## 2018-08-16 DIAGNOSIS — Z1211 Encounter for screening for malignant neoplasm of colon: Secondary | ICD-10-CM

## 2018-08-16 DIAGNOSIS — Z114 Encounter for screening for human immunodeficiency virus [HIV]: Secondary | ICD-10-CM | POA: Diagnosis not present

## 2018-08-16 DIAGNOSIS — Z1159 Encounter for screening for other viral diseases: Secondary | ICD-10-CM

## 2018-08-16 DIAGNOSIS — Z13220 Encounter for screening for lipoid disorders: Secondary | ICD-10-CM | POA: Diagnosis not present

## 2018-08-16 DIAGNOSIS — Z13820 Encounter for screening for osteoporosis: Secondary | ICD-10-CM | POA: Diagnosis not present

## 2018-08-16 DIAGNOSIS — Z1231 Encounter for screening mammogram for malignant neoplasm of breast: Secondary | ICD-10-CM | POA: Diagnosis not present

## 2018-08-16 DIAGNOSIS — E6609 Other obesity due to excess calories: Secondary | ICD-10-CM | POA: Diagnosis not present

## 2018-08-16 DIAGNOSIS — Z6831 Body mass index (BMI) 31.0-31.9, adult: Secondary | ICD-10-CM | POA: Diagnosis not present

## 2018-08-16 DIAGNOSIS — Z7689 Persons encountering health services in other specified circumstances: Secondary | ICD-10-CM

## 2018-08-16 MED ORDER — NA SULFATE-K SULFATE-MG SULF 17.5-3.13-1.6 GM/177ML PO SOLN: 1.0000 | Freq: Once | ORAL | 0 refills | Status: AC

## 2018-08-16 NOTE — Progress Notes (Signed)
Name: Jeanita Carneiro   MRN: [id number]    DOB: Mar 24, 1950   Date:08/16/2018       Progress Note  Subjective  Chief Complaint  Chief Complaint  Patient presents with  . Establish Care    HPI  Pt presents to establish care.    Social: She is a widow; moved back to [place] from [place] to be closer to her children.  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  Her husband died in Mar 17, 2018 and she has been using friends and church as a support.  She has not seen a primary care in over 20 years.  Obesity:  She does not exercise; her diet is poor.  She does not eat fruit, eats some vegetables.  Eats out about 2-3 times in a week. Drinks sweet tea some days, drinks mostly water.   Health Maintenance: See orders.  Declines PNA vaccine.  There are no active problems to display for this patient.   Past Surgical History:  Procedure Laterality Date  . BREAST SURGERY     breast implants    Family History  Problem Relation Age of Onset  . Bladder Cancer Mother   . Diabetes Mother   . Hypertension Mother     Social History   Socioeconomic History  . Marital status: Widowed    Spouse name: Not on file  . Number of children: 2  . Years of education: Not on file  . Highest education level: Not on file  Occupational History  . Not on file  Social Needs  . Financial resource strain: Not hard at all  . Food insecurity    Worry: Never true    Inability: Never true  . Transportation needs    Medical: No    Non-medical: No  Tobacco Use  . Smoking status: Never Smoker  . Smokeless tobacco: Never Used  Substance and Sexual Activity  . Alcohol use: No  . Drug use: No  . Sexual activity: Not Currently    Partners: Male  Lifestyle  . Physical activity    Days per week: 0 days    Minutes per session: 0 min  . Stress: Not at all  Relationships  . Social connections    Talks on phone: More than three times a week    Gets together: More than three times a week    Attends  religious service: More than 4 times per year    Active member of club or organization: Yes    Attends meetings of clubs or organizations: More than 4 times per year    Relationship status: Widowed  . Intimate partner violence    Fear of current or ex partner: No    Emotionally abused: No    Physically abused: No    Forced sexual activity: No  Other Topics Concern  . Not on file  Social History Narrative  . Not on file     Current Outpatient Medications:  .  albuterol (PROVENTIL HFA;VENTOLIN HFA) 108 (90 Base) MCG/ACT inhaler, Inhale 1-2 puffs into the lungs every 6 (six) hours as needed for wheezing or shortness of breath. (Patient not taking: Reported on 08/16/2018), Disp: 1 Inhaler, Rfl: 0 .  benzonatate (TESSALON) 200 MG capsule, Take 1 capsule (200 mg total) by mouth 3 (three) times daily as needed. (Patient not taking: Reported on 08/16/2018), Disp: 30 capsule, Rfl: 0 .  mupirocin ointment (BACTROBAN) 2 %, Apply two times a day for 7 days. (  Patient not taking: Reported on 08/16/2018), Disp: 22 g, Rfl: 0  No Known Allergies  I personally reviewed active problem list, medication list, allergies, notes from last encounter, lab results with the patient/caregiver today.   ROS  Constitutional: Negative for fever or weight change.  Respiratory: Negative for cough and shortness of breath.   Cardiovascular: Negative for chest pain or palpitations.  Gastrointestinal: Negative for abdominal pain, no bowel changes.  Musculoskeletal: Negative for gait problem or joint swelling.  Skin: Negative for rash.  Neurological: Negative for dizziness or headache.  No other specific complaints in a complete review of systems (except as listed in HPI above).  Objective  Vitals:   08/16/18 1252  BP: 132/84  Pulse: 72  Resp: 16  Temp: (!) 97.1 F (36.2 C)  TempSrc: Oral  SpO2: 99%  Weight: 188 lb 9.6 oz (85.5 kg)  Height: 5\' 5"  (1.651 m)   Body mass index is 31.38 kg/m.  Physical Exam  Constitutional: Patient appears well-developed and well-nourished. No distress.  HENT: Head: Normocephalic and atraumatic. Ears: bilateral TMs with no erythema or effusion; Nose: Nose normal. Mouth/Throat: Oropharynx is clear and moist. No oropharyngeal exudate or tonsillar swelling.  Eyes: Conjunctivae and EOM are normal. No scleral icterus.  Pupils are equal, round, and reactive to light.  Neck: Normal range of motion. Neck supple. No JVD present. No thyromegaly present.  Cardiovascular: Normal rate, regular rhythm and normal heart sounds.  No murmur heard. No BLE edema. Pulmonary/Chest: Effort normal and breath sounds normal. No respiratory distress.. Musculoskeletal: Normal range of motion, no joint effusions. No gross deformities Neurological: Pt is alert and oriented to person, place, and time. No cranial nerve deficit. Coordination, balance, strength, speech and gait are normal.  Skin: Skin is warm and dry. No rash noted. No erythema.  Psychiatric: Patient has a normal mood and affect. behavior is normal. Judgment and thought content normal.  No results found for this or any previous visit (from the past 72 hour(s)).  PHQ2/9: Depression screen PHQ 2/9 08/16/2018  Decreased Interest 0  Down, Depressed, Hopeless 0  PHQ - 2 Score 0  Altered sleeping 0  Tired, decreased energy 0  Change in appetite 0  Feeling bad or failure about yourself  0  Trouble concentrating 0  Moving slowly or fidgety/restless 0  Suicidal thoughts 0  PHQ-9 Score 0  Difficult doing work/chores Not difficult at all   PHQ-2/9 Result is negative.    Fall Risk: Fall Risk  08/16/2018  Falls in the past year? 0  Number falls in past yr: 0  Injury with Fall? 0  Follow up Falls evaluation completed   Assessment & Plan  1. Class 1 obesity due to excess calories without serious comorbidity with body mass index (BMI) of 31.0 to 31.9 in adult - Discussed importance of 150 minutes of physical activity weekly, eat two  servings of fish weekly, eat one serving of tree nuts ( cashews, pistachios, pecans, almonds.Marland Kitchen) every other day, eat 6 servings of fruit/vegetables daily and drink plenty of water and avoid sweet beverages.  - COMPLETE METABOLIC PANEL WITH GFR - Lipid panel  2. Encounter for colorectal cancer screening - Ambulatory referral to Gastroenterology  3. Breast cancer screening by mammogram - MM 3D SCREEN BREAST BILATERAL; Future  4. Osteoporosis screening - HM DEXA SCAN  5. Need for hepatitis C screening test - Hepatitis C antibody  6. Encounter for screening for HIV - HIV Antibody (routine testing w rflx)  7. Lipid  screening - Lipid panel  8. Encounter to establish care

## 2018-08-16 NOTE — Telephone Encounter (Signed)
Gastroenterology Pre-Procedure Review  Request Date: 08/25/18 Requesting Physician: Dr. Allen Norris  PATIENT REVIEW QUESTIONS: The patient responded to the following health history questions as indicated:    1. Are you having any GI issues? occasional nervous stomach, declined appt at this time 2. Do you have a personal history of Polyps? no 3. Do you have a family history of Colon Cancer or Polyps? no 4. Diabetes Mellitus? no 5. Joint replacements in the past 12 months?no 6. Major health problems in the past 3 months?no 7. Any artificial heart valves, MVP, or defibrillator?no    MEDICATIONS & ALLERGIES:    Patient reports the following regarding taking any anticoagulation/antiplatelet therapy:   Plavix, Coumadin, Eliquis, Xarelto, Lovenox, Pradaxa, Brilinta, or Effient? no Aspirin? no  Patient confirms/reports the following medications:  Current Outpatient Medications  Medication Sig Dispense Refill  . Na Sulfate-K Sulfate-Mg Sulf 17.5-3.13-1.6 GM/177ML SOLN Take 1 kit by mouth once for 1 dose. 354 mL 0   No current facility-administered medications for this visit.     Patient confirms/reports the following allergies:  No Known Allergies  No orders of the defined types were placed in this encounter.   AUTHORIZATION INFORMATION Primary Insurance: 1D#: Group #:  Secondary Insurance: 1D#: Group #:  SCHEDULE INFORMATION: Date:08/25/18  Time: Location:[facility]

## 2018-08-17 ENCOUNTER — Encounter: Payer: Self-pay | Admitting: Family Medicine

## 2018-08-17 ENCOUNTER — Other Ambulatory Visit: Payer: Self-pay

## 2018-08-17 ENCOUNTER — Encounter: Payer: Self-pay | Admitting: *Deleted

## 2018-08-17 DIAGNOSIS — R739 Hyperglycemia, unspecified: Secondary | ICD-10-CM

## 2018-08-17 LAB — COMPLETE METABOLIC PANEL WITH GFR
AG Ratio: 1.6 (calc) (ref 1.0–2.5)
ALT: 23 U/L (ref 6–29)
AST: 24 U/L (ref 10–35)
Albumin: 4.4 g/dL (ref 3.6–5.1)
Alkaline phosphatase (APISO): 57 U/L (ref 37–153)
BUN: 20 mg/dL (ref 7–25)
CO2: 25 mmol/L (ref 20–32)
Calcium: 9.6 mg/dL (ref 8.6–10.4)
Chloride: 106 mmol/L (ref 98–110)
Creat: 0.92 mg/dL (ref 0.50–0.99)
GFR, Est African American: 74 mL/min/{1.73_m2} (ref >=60)
GFR, Est Non African American: 64 mL/min/{1.73_m2} (ref >=60)
Globulin: 2.8 g/dL (calc) (ref 1.9–3.7)
Glucose, Bld: 119 mg/dL — ABNORMAL HIGH (ref 65–99)
Potassium: 3.9 mmol/L (ref 3.5–5.3)
Sodium: 139 mmol/L (ref 135–146)
Total Bilirubin: 0.4 mg/dL (ref 0.2–1.2)
Total Protein: 7.2 g/dL (ref 6.1–8.1)

## 2018-08-17 LAB — LIPID PANEL
Cholesterol: 194 mg/dL (ref <200)
HDL: 56 mg/dL (ref >=50)
LDL Cholesterol (Calc): 112 mg/dL (calc) — ABNORMAL HIGH
Non-HDL Cholesterol (Calc): 138 mg/dL (calc) — ABNORMAL HIGH (ref <130)
Total CHOL/HDL Ratio: 3.5 (calc) (ref <5.0)
Triglycerides: 138 mg/dL (ref <150)

## 2018-08-17 LAB — HIV ANTIBODY (ROUTINE TESTING W REFLEX): HIV 1&2 Ab, 4th Generation: NONREACTIVE

## 2018-08-17 LAB — HEPATITIS C ANTIBODY
Hepatitis C Ab: NONREACTIVE
SIGNAL TO CUT-OFF: 0.02 (ref <1.00)

## 2018-08-18 NOTE — Telephone Encounter (Signed)
-----   Message from Hubbard Hartshorn, Beecher sent at 08/17/2018 12:15 PM EDT ----- ##Venna Berberich please add on A1C if possible, if not, have patient come back in for POCT A1C. Thanks!##  Hello Ms. Angela Ponce,  I am writing to let you know that I have reviewed your results:  HIV is negative - good news.  Hep C is pending and may take up to a week to come back.  CMP is normal except for elevated blood sugar - we will try to add on an A1C, but may need you to come in to have it checked.  The A1C will determine if you have prediabetes or diabetes.  Cholesterol is just above the threshold for needing medication.  I'm going to leave this up to you - if you want to try diet and exercise to lower your numbers for 3 months and recheck we can do that.  If you're willing to try medication, I will send in some medication to take (Atorvastatin 20mg  once daily).  Just message me back to let me know.  Some strategies to focus on to help improve your LDL levels:  - Eat 20 to 30 grams of fiber every day.  - Eat Foods such as fruits and vegetables, whole grains, beans, peas, nuts, and seeds can help lower LDL. - Avoid Saturated fats - Dairy foods - such as butter, cream, ghee, regular-fat milk and cheese. Meat - such as fatty cuts of beef, pork and lamb, processed meats like salami, sausages and the skin on chicken. Lard., fatty snack foods, cakes, biscuits, pies and deep fried foods) - Avoid smoking   Please feel free to message me back or call the clinic with any questions you may have. Thank you so much, [name] NP-C  The 10-year ASCVD risk score Mikey Bussing DC Brooke Bonito., et al., 2013) is: 8%   Values used to calculate the score:     Age: 68 years     Sex: Female     Is Non-Hispanic African American: No     Diabetic: No     Tobacco smoker: No     Systolic Blood Pressure: 612 mmHg     Is BP treated: No     HDL Cholesterol: 56 mg/dL     Total Cholesterol: 194 mg/dL

## 2018-08-22 ENCOUNTER — Other Ambulatory Visit
Admission: RE | Admit: 2018-08-22 | Discharge: 2018-08-22 | Disposition: A | Discharge status: Home / Self Care | Payer: Medicare Other | Source: Ambulatory Visit | Attending: Gastroenterology | Admitting: Gastroenterology

## 2018-08-22 ENCOUNTER — Encounter: Payer: Self-pay | Admitting: Family Medicine

## 2018-08-22 ENCOUNTER — Other Ambulatory Visit: Payer: Self-pay

## 2018-08-22 ENCOUNTER — Telehealth: Payer: Self-pay | Admitting: Gastroenterology

## 2018-08-22 ENCOUNTER — Other Ambulatory Visit: Payer: Self-pay | Admitting: Family Medicine

## 2018-08-22 DIAGNOSIS — Z13820 Encounter for screening for osteoporosis: Secondary | ICD-10-CM

## 2018-08-22 DIAGNOSIS — Z1159 Encounter for screening for other viral diseases: Secondary | ICD-10-CM | POA: Diagnosis not present

## 2018-08-22 MED ORDER — GOLYTELY 236 G PO SOLR: ORAL | 0 refills | Status: DC

## 2018-08-22 NOTE — Telephone Encounter (Signed)
Patient called & l/m wanting to know if she can she drink crystal light peach tea with prep for colonoscopy on 08-25-18

## 2018-08-23 LAB — SARS CORONAVIRUS 2 (TAT 6-24 HRS): SARS Coronavirus 2: NEGATIVE

## 2018-08-24 ENCOUNTER — Telehealth: Payer: Self-pay | Admitting: Gastroenterology

## 2018-08-24 NOTE — Telephone Encounter (Signed)
Patient called & has a colonoscopy tomorrow 08-25-18. She has questions about the prep. She was prescribed suprep & was to costly so she went with go lightly.Has questions about them.

## 2018-08-24 NOTE — Discharge Instructions (Signed)

## 2018-08-24 NOTE — Telephone Encounter (Signed)
Patient has been informed that she may mix her gavelyte with her peach crystal light.  She was concerned about not being able to consume the entire amount.  I advised her to begin drinking it earlier in the day maybe between 2pm and 3pm to allow additional time to drink it all.  Thanks [employer]

## 2018-08-25 ENCOUNTER — Ambulatory Visit: Payer: Medicare Other | Admitting: Anesthesiology

## 2018-08-25 ENCOUNTER — Ambulatory Visit
Admission: RE | Admit: 2018-08-25 | Discharge: 2018-08-25 | Disposition: A | Discharge status: Home / Self Care | Payer: Medicare Other | Attending: Gastroenterology | Admitting: Gastroenterology

## 2018-08-25 ENCOUNTER — Encounter
Admission: RE | Disposition: A | Discharge status: Home / Self Care | Payer: Self-pay | Source: Home / Self Care | Attending: Gastroenterology

## 2018-08-25 DIAGNOSIS — Z1211 Encounter for screening for malignant neoplasm of colon: Secondary | ICD-10-CM | POA: Diagnosis not present

## 2018-08-25 DIAGNOSIS — D128 Benign neoplasm of rectum: Secondary | ICD-10-CM | POA: Diagnosis not present

## 2018-08-25 DIAGNOSIS — K635 Polyp of colon: Secondary | ICD-10-CM

## 2018-08-25 DIAGNOSIS — D122 Benign neoplasm of ascending colon: Secondary | ICD-10-CM | POA: Diagnosis not present

## 2018-08-25 DIAGNOSIS — D120 Benign neoplasm of cecum: Secondary | ICD-10-CM | POA: Diagnosis not present

## 2018-08-25 DIAGNOSIS — K621 Rectal polyp: Secondary | ICD-10-CM

## 2018-08-25 HISTORY — DX: Other specified health status: Z78.9

## 2018-08-25 HISTORY — PX: COLONOSCOPY WITH PROPOFOL: SHX5780

## 2018-08-25 HISTORY — PX: POLYPECTOMY: SHX5525

## 2018-08-25 SURGERY — COLONOSCOPY WITH PROPOFOL
Anesthesia: General | Site: Rectum

## 2018-08-25 MED ORDER — PROPOFOL 10 MG/ML IV BOLUS
INTRAVENOUS | Status: DC | PRN
  Administered 2018-08-25: 40 mg via INTRAVENOUS
  Administered 2018-08-25 (×2): 30 mg via INTRAVENOUS
  Administered 2018-08-25: 10 mg via INTRAVENOUS
  Administered 2018-08-25 (×2): 30 mg via INTRAVENOUS
  Administered 2018-08-25: 40 mg via INTRAVENOUS
  Administered 2018-08-25: 20 mg via INTRAVENOUS
  Administered 2018-08-25: 10 mg via INTRAVENOUS
  Administered 2018-08-25: 70 mg via INTRAVENOUS

## 2018-08-25 MED ORDER — LIDOCAINE HCL (CARDIAC) PF 100 MG/5ML IV SOSY
PREFILLED_SYRINGE | INTRAVENOUS | Status: DC | PRN
  Administered 2018-08-25: 50 mg via INTRAVENOUS

## 2018-08-25 MED ORDER — STERILE WATER FOR IRRIGATION IR SOLN
Status: DC | PRN
  Administered 2018-08-25: 30 mL

## 2018-08-25 MED ORDER — SODIUM CHLORIDE 0.9 % IV SOLN: INTRAVENOUS | Status: DC

## 2018-08-25 MED ORDER — LACTATED RINGERS IV SOLN
INTRAVENOUS | Status: DC
  Administered 2018-08-25: 11:00:00 via INTRAVENOUS

## 2018-08-25 MED ORDER — OXYCODONE HCL 5 MG/5ML PO SOLN: 5.0000 mg | Freq: Once | ORAL | Status: DC | PRN

## 2018-08-25 MED ORDER — OXYCODONE HCL 5 MG PO TABS: 5.0000 mg | ORAL_TABLET | Freq: Once | ORAL | Status: DC | PRN

## 2018-08-25 SURGICAL SUPPLY — 11 items
CANISTER SUCT 1200ML W/VALVE (MISCELLANEOUS) ×3 IMPLANT
CLIP HMST 235XBRD CATH ROT (MISCELLANEOUS) ×2 IMPLANT
CLIP RESOLUTION 360 11X235 (MISCELLANEOUS) ×4
ELECT REM PT RETURN 9FT ADLT (ELECTROSURGICAL) ×3
ELECTRODE REM PT RTRN 9FT ADLT (ELECTROSURGICAL) ×1 IMPLANT
GOWN CVR UNV OPN BCK APRN NK (MISCELLANEOUS) ×2 IMPLANT
GOWN ISOL THUMB LOOP REG UNIV (MISCELLANEOUS) ×4
KIT ENDO PROCEDURE OLY (KITS) ×3 IMPLANT
SNARE SHORT THROW 13M SML OVAL (MISCELLANEOUS) ×3 IMPLANT
TRAP ETRAP POLY (MISCELLANEOUS) ×3 IMPLANT
WATER STERILE IRR 250ML POUR (IV SOLUTION) ×3 IMPLANT

## 2018-08-25 NOTE — Anesthesia Preprocedure Evaluation (Signed)
Anesthesia Evaluation  Patient identified by MRN, date of birth, ID band Patient awake    Reviewed: Allergy & Precautions, H&P , NPO status , Patient's Chart, lab work & pertinent test results  History of Anesthesia Complications Negative for: history of anesthetic complications  Airway Mallampati: I  TM Distance: >3 FB Neck ROM: full    Dental no notable dental hx.    Pulmonary neg pulmonary ROS,    Pulmonary exam normal breath sounds clear to auscultation       Cardiovascular Normal cardiovascular exam Rhythm:regular Rate:Normal     Neuro/Psych negative neurological ROS  negative psych ROS   GI/Hepatic negative GI ROS, Neg liver ROS,   Endo/Other  negative endocrine ROS  Renal/GU negative Renal ROS  negative genitourinary   Musculoskeletal   Abdominal   Peds  Hematology negative hematology ROS (+)   Anesthesia Other Findings   Reproductive/Obstetrics                             Anesthesia Physical Anesthesia Plan  ASA: II  Anesthesia Plan: General   Post-op Pain Management:    Induction:   PONV Risk Score and Plan:   Airway Management Planned:   Additional Equipment:   Intra-op Plan:   Post-operative Plan:   Informed Consent: I have reviewed the patients History and Physical, chart, labs and discussed the procedure including the risks, benefits and alternatives for the proposed anesthesia with the patient or authorized representative who has indicated his/her understanding and acceptance.       Plan Discussed with:   Anesthesia Plan Comments:         Anesthesia Quick Evaluation

## 2018-08-25 NOTE — Op Note (Signed)
[facility] Gastroenterology Patient Name: Angela Ponce Procedure Date: 08/25/2018 11:06 AM MRN: [id number] Account #: [id number] Date of Birth: 07-Sep-1950 Admit Type: Outpatient Age: 68 Room: [facility] OR ROOM 01 Gender: Female Note Status: Finalized Procedure:            Colonoscopy Indications:          Screening for colorectal malignant neoplasm Providers:            Lucilla Lame MD, MD Referring MD:         [place] (Referring MD) Medicines:            Propofol per Anesthesia Complications:        No immediate complications. Procedure:            Pre-Anesthesia Assessment:                       - Prior to the procedure, a History and Physical was                        performed, and patient medications and allergies were                        reviewed. The patient's tolerance of previous                        anesthesia was also reviewed. The risks and benefits of                        the procedure and the sedation options and risks were                        discussed with the patient. All questions were                        answered, and informed consent was obtained. Prior                        Anticoagulants: The patient has taken no previous                        anticoagulant or antiplatelet agents. ASA Grade                        Assessment: II - A patient with mild systemic disease.                        After reviewing the risks and benefits, the patient was                        deemed in satisfactory condition to undergo the                        procedure.                       After obtaining informed consent, the colonoscope was                        passed under direct vision. Throughout the procedure,  the patient's blood pressure, pulse, and oxygen                        saturations were monitored continuously. The was                        introduced through the anus and advanced to the the                        cecum, identified by appendiceal orifice and ileocecal                        valve. The colonoscopy was performed without                        difficulty. The patient tolerated the procedure well.                        The quality of the bowel preparation was good. Findings:      The perianal and digital rectal examinations were normal.      A 5 mm polyp was found in the cecum. The polyp was sessile. The polyp       was removed with a cold snare. Resection and retrieval were complete.      A 9 mm polyp was found in the ascending colon. The polyp was       semi-sessile. The polyp was removed with a hot snare. Resection and       retrieval were complete. To prevent bleeding post-intervention, two       hemostatic clips were successfully placed (MR conditional). There was no       bleeding at the end of the procedure.      A 5 mm polyp was found in the rectum. The polyp was sessile. The polyp       was removed with a cold snare. Resection and retrieval were complete. Impression:           - One 5 mm polyp in the cecum, removed with a cold                        snare. Resected and retrieved.                       - One 9 mm polyp in the ascending colon, removed with a                        hot snare. Resected and retrieved. Clips (MR                        conditional) were placed.                       - One 5 mm polyp in the rectum, removed with a cold                        snare. Resected and retrieved. Recommendation:       - Discharge patient to home.                       - Resume previous diet.                       -  Continue present medications.                       - Await pathology results.                       - Repeat colonoscopy in 5 years if polyp adenoma and 10                        years if hyperplastic Procedure Code(s):    --- Professional ---                       [phone], Colonoscopy, flexible; with removal of tumor(s),                         polyp(s), or other lesion(s) by snare technique Diagnosis Code(s):    --- Professional ---                       Z12.11, Encounter for screening for malignant neoplasm                        of colon                       K63.5, Polyp of colon                       K62.1, Rectal polyp CPT copyright 2019 American Medical Association. All rights reserved. The codes documented in this report are preliminary and upon coder review may  be revised to meet current compliance requirements. Lucilla Lame MD, MD 08/25/2018 11:37:42 AM This report has been signed electronically. Number of Addenda: 0 Note Initiated On: 08/25/2018 11:06 AM Scope Withdrawal Time: 0 hours 13 minutes 22 seconds  Total Procedure Duration: 0 hours 16 minutes 8 seconds  Estimated Blood Loss: Estimated blood loss: none.      [facility]

## 2018-08-25 NOTE — Anesthesia Postprocedure Evaluation (Signed)
Anesthesia Post Note  Patient: Angela Ponce  Procedure(s) Performed: COLONOSCOPY WITH BIOPSY (N/A Rectum) POLYPECTOMY (N/A Rectum)  Patient location during evaluation: PACU Anesthesia Type: General Level of consciousness: awake and alert Pain management: pain level controlled Vital Signs Assessment: post-procedure vital signs reviewed and stable Respiratory status: spontaneous breathing Cardiovascular status: blood pressure returned to baseline and stable Anesthetic complications: no    Jaci Standard, III,  Aayushi Solorzano D

## 2018-08-25 NOTE — Anesthesia Procedure Notes (Signed)
Performed by: Amneet Cendejas, CRNA Pre-anesthesia Checklist: Patient identified, Emergency Drugs available, Suction available, Timeout performed and Patient being monitored Patient Re-evaluated:Patient Re-evaluated prior to induction Oxygen Delivery Method: Nasal cannula Placement Confirmation: positive ETCO2       

## 2018-08-25 NOTE — Transfer of Care (Signed)
Immediate Anesthesia Transfer of Care Note  Patient: Angela Ponce  Procedure(s) Performed: COLONOSCOPY WITH BIOPSY (N/A Rectum) POLYPECTOMY (N/A Rectum)  Patient Location: PACU  Anesthesia Type: General  Level of Consciousness: awake, alert  and patient cooperative  Airway and Oxygen Therapy: Patient Spontanous Breathing and Patient connected to supplemental oxygen  Post-op Assessment: Post-op Vital signs reviewed, Patient's Cardiovascular Status Stable, Respiratory Function Stable, Patent Airway and No signs of Nausea or vomiting  Post-op Vital Signs: Reviewed and stable  Complications: No apparent anesthesia complications

## 2018-08-25 NOTE — H&P (Signed)
Lucilla Lame, MD [facility] [address] [place], [place] [address] Phone: [phone] Fax : [phone]  Primary Care Physician:  Hubbard Hartshorn, FNP Primary Gastroenterologist:  Dr. Allen Norris  Pre-Procedure History & Physical: HPI:  Angela Ponce is a 68 y.o. female is here for a screening colonoscopy.   Past Medical History:  Diagnosis Date  . Medical history non-contributory   . No known health problems     Past Surgical History:  Procedure Laterality Date  . BREAST SURGERY     breast implants    Prior to Admission medications   Medication Sig Start Date End Date Taking? Authorizing Provider  polyethylene glycol (GOLYTELY) 236 g solution Drink one 8 oz glass every 20 mins until entire container is finished starting at 5:00pm on 08/24/18 08/22/18   Lucilla Lame, MD    Allergies as of 08/16/2018  . (No Known Allergies)    Family History  Problem Relation Age of Onset  . Bladder Cancer Mother   . Diabetes Mother   . Hypertension Mother     Social History   Socioeconomic History  . Marital status: Widowed    Spouse name: Not on file  . Number of children: 2  . Years of education: Not on file  . Highest education level: Not on file  Occupational History  . Not on file  Social Needs  . Financial resource strain: Not hard at all  . Food insecurity    Worry: Never true    Inability: Never true  . Transportation needs    Medical: No    Non-medical: No  Tobacco Use  . Smoking status: Never Smoker  . Smokeless tobacco: Never Used  Substance and Sexual Activity  . Alcohol use: No  . Drug use: No  . Sexual activity: Not Currently    Partners: Male  Lifestyle  . Physical activity    Days per week: 0 days    Minutes per session: 0 min  . Stress: Not at all  Relationships  . Social connections    Talks on phone: More than three times a week    Gets together: More than three times a week    Attends religious service: More than 4 times per year   Active member of club or organization: Yes    Attends meetings of clubs or organizations: More than 4 times per year    Relationship status: Widowed  . Intimate partner violence    Fear of current or ex partner: No    Emotionally abused: No    Physically abused: No    Forced sexual activity: No  Other Topics Concern  . Not on file  Social History Narrative  . Not on file    Review of Systems: See HPI, otherwise negative ROS  Physical Exam: BP (!) 172/96   Pulse 76   Temp 97.8 F (36.6 C) (Temporal)   Resp 16   Ht 5\' 5"  (1.651 m)   Wt 85.3 kg   SpO2 100%   BMI 31.28 kg/m  General:   Alert,  pleasant and cooperative in NAD Head:  Normocephalic and atraumatic. Neck:  Supple; no masses or thyromegaly. Lungs:  Clear throughout to auscultation.    Heart:  Regular rate and rhythm. Abdomen:  Soft, nontender and nondistended. Normal bowel sounds, without guarding, and without rebound.   Neurologic:  Alert and  oriented x4;  grossly normal neurologically.  Impression/Plan: Angela Ponce is now here to undergo a screening colonoscopy.  Risks, benefits, and alternatives regarding colonoscopy have been reviewed with the patient.  Questions have been answered.  All parties agreeable.

## 2018-08-26 ENCOUNTER — Encounter: Payer: Self-pay | Admitting: Gastroenterology

## 2018-08-26 NOTE — Addendum Note (Signed)
Addendum  created 08/26/18 1517 by Mayme Genta, CRNA   Intraprocedure Event edited, Intraprocedure Staff edited

## 2018-08-29 ENCOUNTER — Encounter: Payer: Self-pay | Admitting: Gastroenterology

## 2018-08-30 ENCOUNTER — Encounter: Payer: Self-pay | Admitting: Gastroenterology

## 2018-09-26 ENCOUNTER — Ambulatory Visit
Admission: RE | Admit: 2018-09-26 | Discharge: 2018-09-26 | Disposition: A | Discharge status: Home / Self Care | Payer: Medicare Other | Source: Ambulatory Visit | Attending: Family Medicine | Admitting: Family Medicine

## 2018-09-26 ENCOUNTER — Other Ambulatory Visit: Payer: Self-pay

## 2018-09-26 DIAGNOSIS — Z1231 Encounter for screening mammogram for malignant neoplasm of breast: Secondary | ICD-10-CM | POA: Diagnosis not present

## 2019-04-28 ENCOUNTER — Ambulatory Visit (INDEPENDENT_AMBULATORY_CARE_PROVIDER_SITE_OTHER): Payer: Medicare Other

## 2019-04-28 VITALS — Ht 65.0 in | Wt 179.0 lb

## 2019-04-28 DIAGNOSIS — Z0000 Encounter for general adult medical examination without abnormal findings: Secondary | ICD-10-CM | POA: Diagnosis not present

## 2019-04-28 NOTE — Patient Instructions (Signed)
Angela Ponce , Thank you for taking time to come for your Medicare Wellness Visit. I appreciate your ongoing commitment to your health goals. Please review the following plan we discussed and let me know if I can assist you in the future.   Screening recommendations/referrals: Colonoscopy: done 08/25/18. Repeat in 2023. Mammogram: done 09/26/18 Bone Density: Please call [phone] to schedule your bone density screening.  Recommended yearly ophthalmology/optometry visit for glaucoma screening and checkup Recommended yearly dental visit for hygiene and checkup  Vaccinations: Influenza vaccine: postponed Pneumococcal vaccine: postponed Tdap vaccine: done 03/19/16 Shingles vaccine: Shingrix discussed. Please contact your pharmacy for coverage information.   Advanced directives: Advance directive discussed with you today. I have provided a copy for you to complete at home and have notarized. Once this is complete please bring a copy in to our office so we can scan it into your chart.  Conditions/risks identified: Recommend increasing physical activity  Next appointment: Please follow up in one year for your Medicare Annual Wellness visit.     Preventive Care 2 Years and Older, Female Preventive care refers to lifestyle choices and visits with your health care provider that can promote health and wellness. What does preventive care include?  A yearly physical exam. This is also called an annual well check.  Dental exams once or twice a year.  Routine eye exams. Ask your health care provider how often you should have your eyes checked.  Personal lifestyle choices, including:  Daily care of your teeth and gums.  Regular physical activity.  Eating a healthy diet.  Avoiding tobacco and drug use.  Limiting alcohol use.  Practicing safe sex.  Taking low-dose aspirin every day.  Taking vitamin and mineral supplements as recommended by your health care provider. What happens during  an annual well check? The services and screenings done by your health care provider during your annual well check will depend on your age, overall health, lifestyle risk factors, and family history of disease. Counseling  Your health care provider may ask you questions about your:  Alcohol use.  Tobacco use.  Drug use.  Emotional well-being.  Home and relationship well-being.  Sexual activity.  Eating habits.  History of falls.  Memory and ability to understand (cognition).  Work and work Statistician.  Reproductive health. Screening  You may have the following tests or measurements:  Height, weight, and BMI.  Blood pressure.  Lipid and cholesterol levels. These may be checked every 5 years, or more frequently if you are over 21 years old.  Skin check.  Lung cancer screening. You may have this screening every year starting at age 62 if you have a 30-pack-year history of smoking and currently smoke or have quit within the past 15 years.  Fecal occult blood test (FOBT) of the stool. You may have this test every year starting at age 59.  Flexible sigmoidoscopy or colonoscopy. You may have a sigmoidoscopy every 5 years or a colonoscopy every 10 years starting at age 61.  Hepatitis C blood test.  Hepatitis B blood test.  Sexually transmitted disease (STD) testing.  Diabetes screening. This is done by checking your blood sugar (glucose) after you have not eaten for a while (fasting). You may have this done every 1-3 years.  Bone density scan. This is done to screen for osteoporosis. You may have this done starting at age 38.  Mammogram. This may be done every 1-2 years. Talk to your health care provider about how often you should have  regular mammograms. Talk with your health care provider about your test results, treatment options, and if necessary, the need for more tests. Vaccines  Your health care provider may recommend certain vaccines, such as:  Influenza  vaccine. This is recommended every year.  Tetanus, diphtheria, and acellular pertussis (Tdap, Td) vaccine. You may need a Td booster every 10 years.  Zoster vaccine. You may need this after age 7.  Pneumococcal 13-valent conjugate (PCV13) vaccine. One dose is recommended after age 28.  Pneumococcal polysaccharide (PPSV23) vaccine. One dose is recommended after age 70. Talk to your health care provider about which screenings and vaccines you need and how often you need them. This information is not intended to replace advice given to you by your health care provider. Make sure you discuss any questions you have with your health care provider. Document Released: 02/22/2015 Document Revised: 10/16/2015 Document Reviewed: 11/27/2014 Elsevier Interactive Patient Education  2017 [employer] Prevention in the Home Falls can cause injuries. They can happen to people of all ages. There are many things you can do to make your home safe and to help prevent falls. What can I do on the outside of my home?  Regularly fix the edges of walkways and driveways and fix any cracks.  Remove anything that might make you trip as you walk through a door, such as a raised step or threshold.  Trim any bushes or trees on the path to your home.  Use bright outdoor lighting.  Clear any walking paths of anything that might make someone trip, such as rocks or tools.  Regularly check to see if handrails are loose or broken. Make sure that both sides of any steps have handrails.  Any raised decks and porches should have guardrails on the edges.  Have any leaves, snow, or ice cleared regularly.  Use sand or salt on walking paths during winter.  Clean up any spills in your garage right away. This includes oil or grease spills. What can I do in the bathroom?  Use night lights.  Install grab bars by the toilet and in the tub and shower. Do not use towel bars as grab bars.  Use non-skid mats or decals  in the tub or shower.  If you need to sit down in the shower, use a plastic, non-slip stool.  Keep the floor dry. Clean up any water that spills on the floor as soon as it happens.  Remove soap buildup in the tub or shower regularly.  Attach bath mats securely with double-sided non-slip rug tape.  Do not have throw rugs and other things on the floor that can make you trip. What can I do in the bedroom?  Use night lights.  Make sure that you have a light by your bed that is easy to reach.  Do not use any sheets or blankets that are too big for your bed. They should not hang down onto the floor.  Have a firm chair that has side arms. You can use this for support while you get dressed.  Do not have throw rugs and other things on the floor that can make you trip. What can I do in the kitchen?  Clean up any spills right away.  Avoid walking on wet floors.  Keep items that you use a lot in easy-to-reach places.  If you need to reach something above you, use a strong step stool that has a grab bar.  Keep electrical cords out of the  way.  Do not use floor polish or wax that makes floors slippery. If you must use wax, use non-skid floor wax.  Do not have throw rugs and other things on the floor that can make you trip. What can I do with my stairs?  Do not leave any items on the stairs.  Make sure that there are handrails on both sides of the stairs and use them. Fix handrails that are broken or loose. Make sure that handrails are as long as the stairways.  Check any carpeting to make sure that it is firmly attached to the stairs. Fix any carpet that is loose or worn.  Avoid having throw rugs at the top or bottom of the stairs. If you do have throw rugs, attach them to the floor with carpet tape.  Make sure that you have a light switch at the top of the stairs and the bottom of the stairs. If you do not have them, ask someone to add them for you. What else can I do to help  prevent falls?  Wear shoes that:  Do not have high heels.  Have rubber bottoms.  Are comfortable and fit you well.  Are closed at the toe. Do not wear sandals.  If you use a stepladder:  Make sure that it is fully opened. Do not climb a closed stepladder.  Make sure that both sides of the stepladder are locked into place.  Ask someone to hold it for you, if possible.  Clearly mark and make sure that you can see:  Any grab bars or handrails.  First and last steps.  Where the edge of each step is.  Use tools that help you move around (mobility aids) if they are needed. These include:  Canes.  Walkers.  Scooters.  Crutches.  Turn on the lights when you go into a dark area. Replace any light bulbs as soon as they burn out.  Set up your furniture so you have a clear path. Avoid moving your furniture around.  If any of your floors are uneven, fix them.  If there are any pets around you, be aware of where they are.  Review your medicines with your doctor. Some medicines can make you feel dizzy. This can increase your chance of falling. Ask your doctor what other things that you can do to help prevent falls. This information is not intended to replace advice given to you by your health care provider. Make sure you discuss any questions you have with your health care provider. Document Released: 11/22/2008 Document Revised: 07/04/2015 Document Reviewed: 03/02/2014 Elsevier Interactive Patient Education  2017 [employer].

## 2019-04-28 NOTE — Progress Notes (Signed)
Subjective:   Angela Ponce is a 69 y.o. female who presents for an Initial Medicare Annual Wellness Visit.  Virtual Visit via Telephone Note  I connected with [facility] on 04/28/19 at 11:20 AM EDT by telephone and verified that I am speaking with the correct person using two identifiers.  Medicare Annual Wellness visit completed telephonically due to Covid-19 pandemic.   Location: Patient: home Provider: office   I discussed the limitations, risks, security and privacy concerns of performing an evaluation and management service by telephone and the availability of in person appointments. The patient expressed understanding and agreed to proceed.  Some vital signs may be absent or patient reported.   Clemetine Marker, LPN    Review of Systems      Cardiac Risk Factors include: advanced age (>4men, >21 women)     Objective:    Today's Vitals   04/28/19 1125  Weight: 179 lb (81.2 kg)  Height: 5\' 5"  (1.651 m)   Body mass index is 29.79 kg/m.  Advanced Directives 04/28/2019 08/25/2018 04/15/2018  Does Patient Have a Medical Advance Directive? No No No  Would patient like information on creating a medical advance directive? Yes (MAU/Ambulatory/Procedural Areas - Information given) Yes (MAU/Ambulatory/Procedural Areas - Information given) -    Current Medications (verified) Outpatient Encounter Medications as of 04/28/2019  Medication Sig  . [DISCONTINUED] polyethylene glycol (GOLYTELY) 236 g solution Drink one 8 oz glass every 20 mins until entire container is finished starting at 5:00pm on 08/24/18   No facility-administered encounter medications on file as of 04/28/2019.    Allergies (verified) Patient has no known allergies.   History: Past Medical History:  Diagnosis Date  . Medical history non-contributory   . No known health problems    Past Surgical History:  Procedure Laterality Date  . AUGMENTATION MAMMAPLASTY Bilateral 1979  . BREAST SURGERY     breast implants  . COLONOSCOPY WITH PROPOFOL N/A 08/25/2018   Procedure: COLONOSCOPY WITH BIOPSY;  Surgeon: Lucilla Lame, MD;  Location: [facility];  Service: Endoscopy;  Laterality: N/A;  . POLYPECTOMY N/A 08/25/2018   Procedure: POLYPECTOMY;  Surgeon: Lucilla Lame, MD;  Location: [place] [facility];  Service: Endoscopy;  Laterality: N/A;  2 Clips placed at site of [facility] Polyp removal   Family History  Problem Relation Age of Onset  . Bladder Cancer Mother   . Diabetes Mother   . Hypertension Mother    Social History   Socioeconomic History  . Marital status: Widowed    Spouse name: Not on file  . Number of children: 2  . Years of education: Not on file  . Highest education level: Not on file  Occupational History  . Not on file  Tobacco Use  . Smoking status: Never Smoker  . Smokeless tobacco: Never Used  Substance and Sexual Activity  . Alcohol use: No  . Drug use: No  . Sexual activity: Not Currently    Partners: Male  Other Topics Concern  . Not on file  Social History Narrative  . Not on file   Social Determinants of Health   Financial Resource Strain: Low Risk   . Difficulty of Paying Living Expenses: Not hard at all  Food Insecurity: No Food Insecurity  . Worried About Charity fundraiser in the Last Year: Never true  . Ran Out of Food in the Last Year: Never true  Transportation Needs: No Transportation Needs  . Lack of Transportation (Medical): No  .  Lack of Transportation (Non-Medical): No  Physical Activity: Inactive  . Days of Exercise per Week: 0 days  . Minutes of Exercise per Session: 0 min  Stress: No Stress Concern Present  . Feeling of Stress : Not at all  Social Connections: Slightly Isolated  . Frequency of Communication with Friends and Family: More than three times a week  . Frequency of Social Gatherings with Friends and Family: More than three times a week  . Attends Religious Services: More than 4 times per year  . Active  Member of Clubs or Organizations: Yes  . Attends Archivist Meetings: More than 4 times per year  . Marital Status: Widowed    Tobacco Counseling Counseling given: Not Answered   Clinical Intake:  Pre-visit preparation completed: Yes  Pain : No/denies pain     BMI - recorded: 29.79 Nutritional Status: BMI 25 -29 Overweight Nutritional Risks: None Diabetes: No  How often do you need to have someone help you when you read instructions, pamphlets, or other written materials from your doctor or pharmacy?: 1 - Never  Interpreter Needed?: No  Information entered by :: Clemetine Marker LPN   Activities of Daily Living In your present state of health, do you have any difficulty performing the following activities: 04/28/2019 08/25/2018  Hearing? N N  Comment declines hearing aids -  Vision? N N  Difficulty concentrating or making decisions? N N  Walking or climbing stairs? N N  Dressing or bathing? N N  Doing errands, shopping? N -  Preparing Food and eating ? N -  Using the Toilet? N -  In the past six months, have you accidently leaked urine? N -  Do you have problems with loss of bowel control? N -  Managing your Medications? N -  Managing your Finances? N -  Housekeeping or managing your Housekeeping? N -  Some recent data might be hidden     Immunizations and Health Maintenance Immunization History  Administered Date(s) Administered  . Tdap 03/19/2016   There are no preventive care reminders to display for this patient.  Patient Care Team: Hubbard Hartshorn, FNP as PCP - General (Family Medicine)  Indicate any recent Medical Services you may have received from other than [facility] providers in the past year (date may be approximate).     Assessment:   This is a routine wellness examination for [place].  Hearing/Vision screen  Hearing Screening   125Hz  250Hz  500Hz  1000Hz  2000Hz  3000Hz  4000Hz  6000Hz  8000Hz   Right ear:           Left ear:           Comments:  Pt denies hearing difficulty  Vision Screening Comments: Annual vision screenings done at [facility] issues and exercise activities discussed: Current Exercise Habits: The patient does not participate in regular exercise at present, Exercise limited by: None identified  Goals   None    Depression Screen PHQ 2/9 Scores 04/28/2019 08/16/2018  PHQ - 2 Score 0 0  PHQ- 9 Score - 0    Fall Risk Fall Risk  04/28/2019 08/16/2018  Falls in the past year? 0 0  Number falls in past yr: 0 0  Injury with Fall? 0 0  Risk for fall due to : No Fall Risks -  Follow up Falls prevention discussed Falls evaluation completed    FALL RISK PREVENTION PERTAINING TO THE HOME:  Any stairs in or around the home? Yes  If so, do they  handrails? Yes   Home free of loose throw rugs in walkways, pet beds, electrical cords, etc? Yes  Adequate lighting in your home to reduce risk of falls? Yes   ASSISTIVE DEVICES UTILIZED TO PREVENT FALLS:  Life alert? No  Use of a cane, walker or w/c? No  Grab bars in the bathroom? Yes  Shower chair or bench in shower? Yes  Elevated toilet seat or a handicapped toilet? Yes   DME ORDERS:  DME order needed?  No   TIMED UP AND GO:  Was the test performed? No . Telephonic visit.   Education: Fall risk prevention has been discussed.  Intervention(s) required? No    Cognitive Function: pt declined 6CIT for 2021 AWV; reports no memory issues        Screening Tests Health Maintenance  Topic Date Due  . INFLUENZA VACCINE  05/10/2019 (Originally 09/10/2018)  . PNA vac Low Risk Adult (1 of 2 - PCV13) 08/16/2019 (Originally 06/27/2015)  . MAMMOGRAM  09/25/2020  . COLONOSCOPY  08/24/2021  . TETANUS/TDAP  03/19/2026  . DEXA SCAN  Completed  . Hepatitis C Screening  Completed    Qualifies for Shingles Vaccine? Yes . Due for Shingrix. Education has been provided regarding the importance of this vaccine. Pt has been advised to call insurance company to  determine out of pocket expense. Advised may also receive vaccine at local pharmacy or Health Dept. Verbalized acceptance and understanding.  Tdap: Up to date  Flu Vaccine: Due for Flu vaccine. Does the patient want to receive this vaccine today?  No . Education has been provided regarding the importance of this vaccine but still declined. Advised may receive this vaccine at local pharmacy or Health Dept. Aware to provide a copy of the vaccination record if obtained from local pharmacy or Health Dept. Verbalized acceptance and understanding.  Pneumococcal Vaccine: Due for Pneumococcal vaccine. Does the patient want to receive this vaccine today?  No . Education has been provided regarding the importance of this vaccine but still declined. Advised may receive this vaccine at local pharmacy or Health Dept. Aware to provide a copy of the vaccination record if obtained from local pharmacy or Health Dept. Verbalized acceptance and understanding.   Cancer Screenings:  Colorectal Screening: Completed 08/25/18. Repeat every 3 years;   Mammogram: Completed 09/26/18. Repeat every year;   Bone Density: Ordered 08/22/18. Pt provided with contact information and advised to call to schedule appt.   Lung Cancer Screening: (Low Dose CT Chest recommended if Age 27-80 years, 30 pack-year currently smoking OR have quit w/in 15years.) does not qualify.    Additional Screening:  Hepatitis C Screening: does qualify; Completed 08/16/18  Vision Screening: Recommended annual ophthalmology exams for early detection of glaucoma and other disorders of the eye. Is the patient up to date with their annual eye exam?  Yes  Who is the provider or what is the name of the office in which the pt attends annual eye exams? [facility]  Dental Screening: Recommended annual dental exams for proper oral hygiene  Community Resource Referral:  CRR required this visit?  No      Plan:    I have personally reviewed and addressed the  Medicare Annual Wellness questionnaire and have noted the following in the patient's chart:  A. Medical and social history B. Use of alcohol, tobacco or illicit drugs  C. Current medications and supplements D. Functional ability and status E.  Nutritional status F.  Physical activity G. Advance directives H. List  of other physicians I.  Hospitalizations, surgeries, and ER visits in previous 12 months J.  [place] such as hearing and vision if needed, cognitive and depression L. Referrals and appointments   In addition, I have reviewed and discussed with patient certain preventive protocols, quality metrics, and best practice recommendations. A written personalized care plan for preventive services as well as general preventive health recommendations were provided to patient.   Signed,  Clemetine Marker, LPN Nurse Health Advisor   Nurse Notes: pt advised will be due for annual visit July 2021

## 2019-12-02 DIAGNOSIS — R21 Rash and other nonspecific skin eruption: Secondary | ICD-10-CM | POA: Diagnosis not present

## 2020-03-15 DIAGNOSIS — L308 Other specified dermatitis: Secondary | ICD-10-CM | POA: Diagnosis not present

## 2020-04-15 DIAGNOSIS — L570 Actinic keratosis: Secondary | ICD-10-CM | POA: Diagnosis not present

## 2020-04-15 DIAGNOSIS — D485 Neoplasm of uncertain behavior of skin: Secondary | ICD-10-CM | POA: Diagnosis not present

## 2020-04-19 DIAGNOSIS — X32XXXA Exposure to sunlight, initial encounter: Secondary | ICD-10-CM | POA: Diagnosis not present

## 2020-04-19 DIAGNOSIS — L570 Actinic keratosis: Secondary | ICD-10-CM | POA: Diagnosis not present

## 2020-05-02 ENCOUNTER — Ambulatory Visit: Payer: Medicare Other

## 2020-08-27 ENCOUNTER — Encounter: Payer: Self-pay | Admitting: Family Medicine

## 2020-08-27 ENCOUNTER — Other Ambulatory Visit: Payer: Self-pay

## 2020-08-27 ENCOUNTER — Ambulatory Visit (INDEPENDENT_AMBULATORY_CARE_PROVIDER_SITE_OTHER): Payer: Medicare Other | Admitting: Family Medicine

## 2020-08-27 VITALS — BP 122/80 | HR 69 | Temp 98.1°F | Resp 16 | Ht 65.0 in | Wt 183.5 lb

## 2020-08-27 DIAGNOSIS — E669 Obesity, unspecified: Secondary | ICD-10-CM | POA: Diagnosis not present

## 2020-08-27 DIAGNOSIS — Z6831 Body mass index (BMI) 31.0-31.9, adult: Secondary | ICD-10-CM | POA: Diagnosis not present

## 2020-08-27 DIAGNOSIS — R739 Hyperglycemia, unspecified: Secondary | ICD-10-CM | POA: Diagnosis not present

## 2020-08-27 DIAGNOSIS — E785 Hyperlipidemia, unspecified: Secondary | ICD-10-CM

## 2020-08-27 DIAGNOSIS — Z780 Asymptomatic menopausal state: Secondary | ICD-10-CM | POA: Diagnosis not present

## 2020-08-27 DIAGNOSIS — Z1231 Encounter for screening mammogram for malignant neoplasm of breast: Secondary | ICD-10-CM

## 2020-08-27 NOTE — Progress Notes (Signed)
Name: Angela Ponce   MRN: [id number]    DOB: 1950/04/08   Date:08/27/2020       Progress Note  Chief Complaint  Patient presents with   Obesity    Discuss weight loss med (saxenda)    Subjective:   Angela Ponce is a 70 y.o. female, presents to clinic for f/up  Pt was seen in clinic once when she first established - with Angela Ponce 08/2018, no office visits for the past two years Per chart and lab review done today personally, appears she has dx of HLD and obesity In 2018-05-06 her husband passed away and she moved to [facility] from [place], had not been seeing primary care for 20 + years  Pt has requested multiple times blood type but she was explained why we could not do that unless she needed blood transfusion or she could go donate blood   HLD Lab Results  Component Value Date   CHOL 194 08/16/2018   HDL 56 08/16/2018   LDLCALC 112 (H) 08/16/2018   TRIG 138 08/16/2018   CHOLHDL 3.5 08/16/2018   The 10-year ASCVD risk score Angela Ponce DC Jr., et al., 06-May-2011) is: 8.6%   Values used to calculate the score:     Age: 69 years     Sex: Female     Is Non-Hispanic African American: No     Diabetic: No     Tobacco smoker: No     Systolic Blood Pressure: 412 mmHg     Is BP treated: No     HDL Cholesterol: 56 mg/dL     Total Cholesterol: 194 mg/dL Last ASCVD score in 06-May-2018 per Raquel Sarna was 8% and she discussed options of diet/lifestyle follow up or starting meds with pt  Blood sugar elevated previously, was not able to add on A1C in the past    Wants to discuss obesity and saxenda today-  Wt Readings from Last 6 Encounters:  08/27/20 183 lb 8 oz (83.2 kg)  May 06, 2019 179 lb (81.2 kg)  08/25/18 188 lb (85.3 kg)  08/16/18 188 lb 9.6 oz (85.5 kg)  04/15/18 185 lb (83.9 kg)  04/13/17 180 lb (81.6 kg)   BMI Readings from Last 5 Encounters:  08/27/20 30.54 kg/m  05-06-19 29.79 kg/m  08/25/18 31.28 kg/m  08/16/18 31.38 kg/m  04/15/18 30.32 kg/m   Has being around 180 and feels stuck  at the same weight, friends have tried saxenda   She has tried keto, other strict diet plans She is not exercising due to weather   The requested drug is being used as an adjunct to lifestyle modification (e.g., dietary or caloric restriction, exercise, behavioral support, community based program). The patient has engaged in > 28months of lifestyle modification and failed to achieve desired weight loss. The patient has BMI greater than or equal to 30 kilograms / square meter with additional disorder related to weight (high cholesterol, hypertension, diabetes, or sleep apnea).    No current outpatient medications on file.  Patient Active Problem List   Diagnosis Date Noted   Encounter for screening colonoscopy    Polyp of ascending colon    Rectal polyp     Past Surgical History:  Procedure Laterality Date   AUGMENTATION MAMMAPLASTY Bilateral 1979   BREAST SURGERY     breast implants   COLONOSCOPY WITH PROPOFOL N/A 08/25/2018   Procedure: COLONOSCOPY WITH BIOPSY;  Surgeon: Lucilla Lame, MD;  Location: [place];  Service: Endoscopy;  Laterality: N/A;  POLYPECTOMY N/A 08/25/2018   Procedure: POLYPECTOMY;  Surgeon: Lucilla Lame, MD;  Location: [place];  Service: Endoscopy;  Laterality: N/A;  2 Clips placed at site of Ascending Polyp removal    Family History  Problem Relation Age of Onset   Bladder Cancer Mother    Diabetes Mother    Hypertension Mother     Social History   Tobacco Use   Smoking status: Never   Smokeless tobacco: Never  Vaping Use   Vaping Use: Never used  Substance Use Topics   Alcohol use: No   Drug use: No     No Known Allergies  Health Maintenance  Topic Date Due   COVID-19 Vaccine (1) Never done   Zoster Vaccines- Shingrix (1 of 2) Never done   PNA vac Low Risk Adult (1 of 2 - PCV13) Never done   MAMMOGRAM  09/26/2019   DEXA SCAN  08/15/2020   INFLUENZA VACCINE  09/09/2020   COLONOSCOPY (Pts 45-37yrs Insurance coverage  will need to be confirmed)  08/24/2021   TETANUS/TDAP  03/19/2026   Hepatitis C Screening  Completed   HPV VACCINES  Aged Out    Chart Review Today: I personally reviewed active problem list, medication list, allergies, family history, social history, health maintenance, notes from last encounter, lab results, imaging with the patient/caregiver today.   Review of Systems  Constitutional: Negative.   HENT: Negative.    Eyes: Negative.   Respiratory: Negative.    Cardiovascular: Negative.   Gastrointestinal: Negative.   Endocrine: Negative.   Genitourinary: Negative.   Musculoskeletal: Negative.   Skin: Negative.   Allergic/Immunologic: Negative.   Neurological: Negative.   Hematological: Negative.   Psychiatric/Behavioral: Negative.    All other systems reviewed and are negative.   Objective:   Vitals:   08/27/20 1443  BP: 122/80  Pulse: 69  Resp: 16  Temp: 98.1 F (36.7 C)  SpO2: 94%  Weight: 183 lb 8 oz (83.2 kg)  Height: 5\' 5"  (1.651 m)    Body mass index is 30.54 kg/m.  Physical Exam Vitals and nursing note reviewed.  Constitutional:      General: She is not in acute distress.    Appearance: Normal appearance. She is well-developed. She is obese. She is not ill-appearing, toxic-appearing or diaphoretic.     Interventions: Face mask in place.  HENT:     Head: Normocephalic and atraumatic.     Right Ear: External ear normal.     Left Ear: External ear normal.  Eyes:     General: Lids are normal. No scleral icterus.       Right eye: No discharge.        Left eye: No discharge.     Conjunctiva/sclera: Conjunctivae normal.  Neck:     Trachea: Phonation normal. No tracheal deviation.  Cardiovascular:     Rate and Rhythm: Normal rate and regular rhythm.     Pulses: Normal pulses.          Radial pulses are 2+ on the right side and 2+ on the left side.       Posterior tibial pulses are 2+ on the right side and 2+ on the left side.     Heart sounds: Normal  heart sounds. No murmur heard.   No friction rub. No gallop.  Pulmonary:     Effort: Pulmonary effort is normal. No respiratory distress.     Breath sounds: Normal breath sounds. No stridor. No wheezing, rhonchi or rales.  Chest:     Chest wall: No tenderness.  Abdominal:     General: Bowel sounds are normal. There is no distension.     Palpations: Abdomen is soft.  Musculoskeletal:     Right lower leg: No edema.     Left lower leg: No edema.  Skin:    General: Skin is warm and dry.     Coloration: Skin is not jaundiced or pale.     Findings: No rash.  Neurological:     Mental Status: She is alert.     Motor: No abnormal muscle tone.     Gait: Gait normal.  Psychiatric:        Mood and Affect: Mood normal.        Speech: Speech normal.        Behavior: Behavior normal.        Assessment & Plan:     ICD-10-CM   1. Class 1 obesity with body mass index (BMI) of 31.0 to 31.9 in adult, unspecified obesity type, unspecified whether serious comorbidity present  T01.7 COMPLETE METABOLIC PANEL WITH GFR   Z68.31 Lipid panel    Hemoglobin A1c   work on diet/lifestyle, bring in log of exercise, average calories, reviewed with pt med options and referral options    2. Hyperlipidemia, unspecified hyperlipidemia type  B93.9 COMPLETE METABOLIC PANEL WITH GFR    Lipid panel   not on meds, discussed and reviewed ASCVD risk    3. Hyperglycemia  Q30.0 COMPLETE METABOLIC PANEL WITH GFR    Hemoglobin A1c   high blood sugar previously but not able to add A1C     4. Postmenopausal estrogen deficiency  Z78.0 DG Bone Density    5. Encounter for screening mammogram for malignant neoplasm of breast  Z12.31 MM DIGITAL SCREENING W/ IMPLANTS BILATERAL      F/up in 3 month for weight, f/up with insurance of covered meds for weight loss/referrals   Delsa Grana, PA-C 08/27/20 2:57 PM

## 2020-08-27 NOTE — Patient Instructions (Addendum)
To try and get the medication approved we usually need to document and work on a plan and show that plan has failed - example below:  The requested drug is being used as an adjunct to lifestyle modification (e.g., dietary or caloric restriction, exercise, behavioral support, community based program). The patient has engaged in > 15months of lifestyle modification and failed to achieve desired weight loss. The patient has BMI greater than or equal to 30 kilograms / square meter with additional disorder related to weight (high cholesterol, hypertension, diabetes, or sleep apnea).      Work on tracking your calories and intake   [facility] at [facility] [place],  [place]  [address] Get Driving Directions Main: [phone]

## 2020-08-28 ENCOUNTER — Encounter: Payer: Self-pay | Admitting: Family Medicine

## 2020-08-28 LAB — COMPLETE METABOLIC PANEL WITH GFR
AG Ratio: 1.6 (calc) (ref 1.0–2.5)
ALT: 17 U/L (ref 6–29)
AST: 19 U/L (ref 10–35)
Albumin: 4.4 g/dL (ref 3.6–5.1)
Alkaline phosphatase (APISO): 60 U/L (ref 37–153)
BUN: 22 mg/dL (ref 7–25)
CO2: 28 mmol/L (ref 20–32)
Calcium: 10.3 mg/dL (ref 8.6–10.4)
Chloride: 104 mmol/L (ref 98–110)
Creat: 0.98 mg/dL (ref 0.60–1.00)
Globulin: 2.7 g/dL (calc) (ref 1.9–3.7)
Glucose, Bld: 97 mg/dL (ref 65–99)
Potassium: 4.5 mmol/L (ref 3.5–5.3)
Sodium: 139 mmol/L (ref 135–146)
Total Bilirubin: 0.4 mg/dL (ref 0.2–1.2)
Total Protein: 7.1 g/dL (ref 6.1–8.1)
eGFR: 62 mL/min/{1.73_m2} (ref >=60)

## 2020-08-28 LAB — LIPID PANEL
Cholesterol: 231 mg/dL — ABNORMAL HIGH (ref <200)
HDL: 57 mg/dL (ref >=50)
LDL Cholesterol (Calc): 138 mg/dL (calc) — ABNORMAL HIGH
Non-HDL Cholesterol (Calc): 174 mg/dL (calc) — ABNORMAL HIGH (ref <130)
Total CHOL/HDL Ratio: 4.1 (calc) (ref <5.0)
Triglycerides: 219 mg/dL — ABNORMAL HIGH (ref <150)

## 2020-08-28 LAB — HEMOGLOBIN A1C
Hgb A1c MFr Bld: 6 % of total Hgb — ABNORMAL HIGH (ref <5.7)
Mean Plasma Glucose: 126 mg/dL
eAG (mmol/L): 7 mmol/L

## 2020-09-24 ENCOUNTER — Telehealth: Payer: Self-pay | Admitting: Family Medicine

## 2020-09-24 NOTE — Telephone Encounter (Signed)
Copied from [place] [id number]. Topic: Medicare AWV >> Sep 24, 2020 12:11 PM Cher Nakai R wrote: Reason for CRM:  Left message for patient to call back and schedule Medicare Annual Wellness Visit (AWV) in office.   If unable to come into the office for AWV,  please offer to do virtually or by telephone.  Last AWV: 04/28/2019  Please schedule at anytime with [place].  40 minute appointment  Any questions, please contact me at [phone]

## 2020-10-03 IMAGING — CR DG CHEST 2V
2 series · 2 of 2 positions shown · non-contrast
Comparison: None.

CLINICAL DATA: Upper chest tightness and wheezing with
nonproductive cough. Recent travel to [place].

EXAM:
CHEST - 2 VIEW

[chest pa]
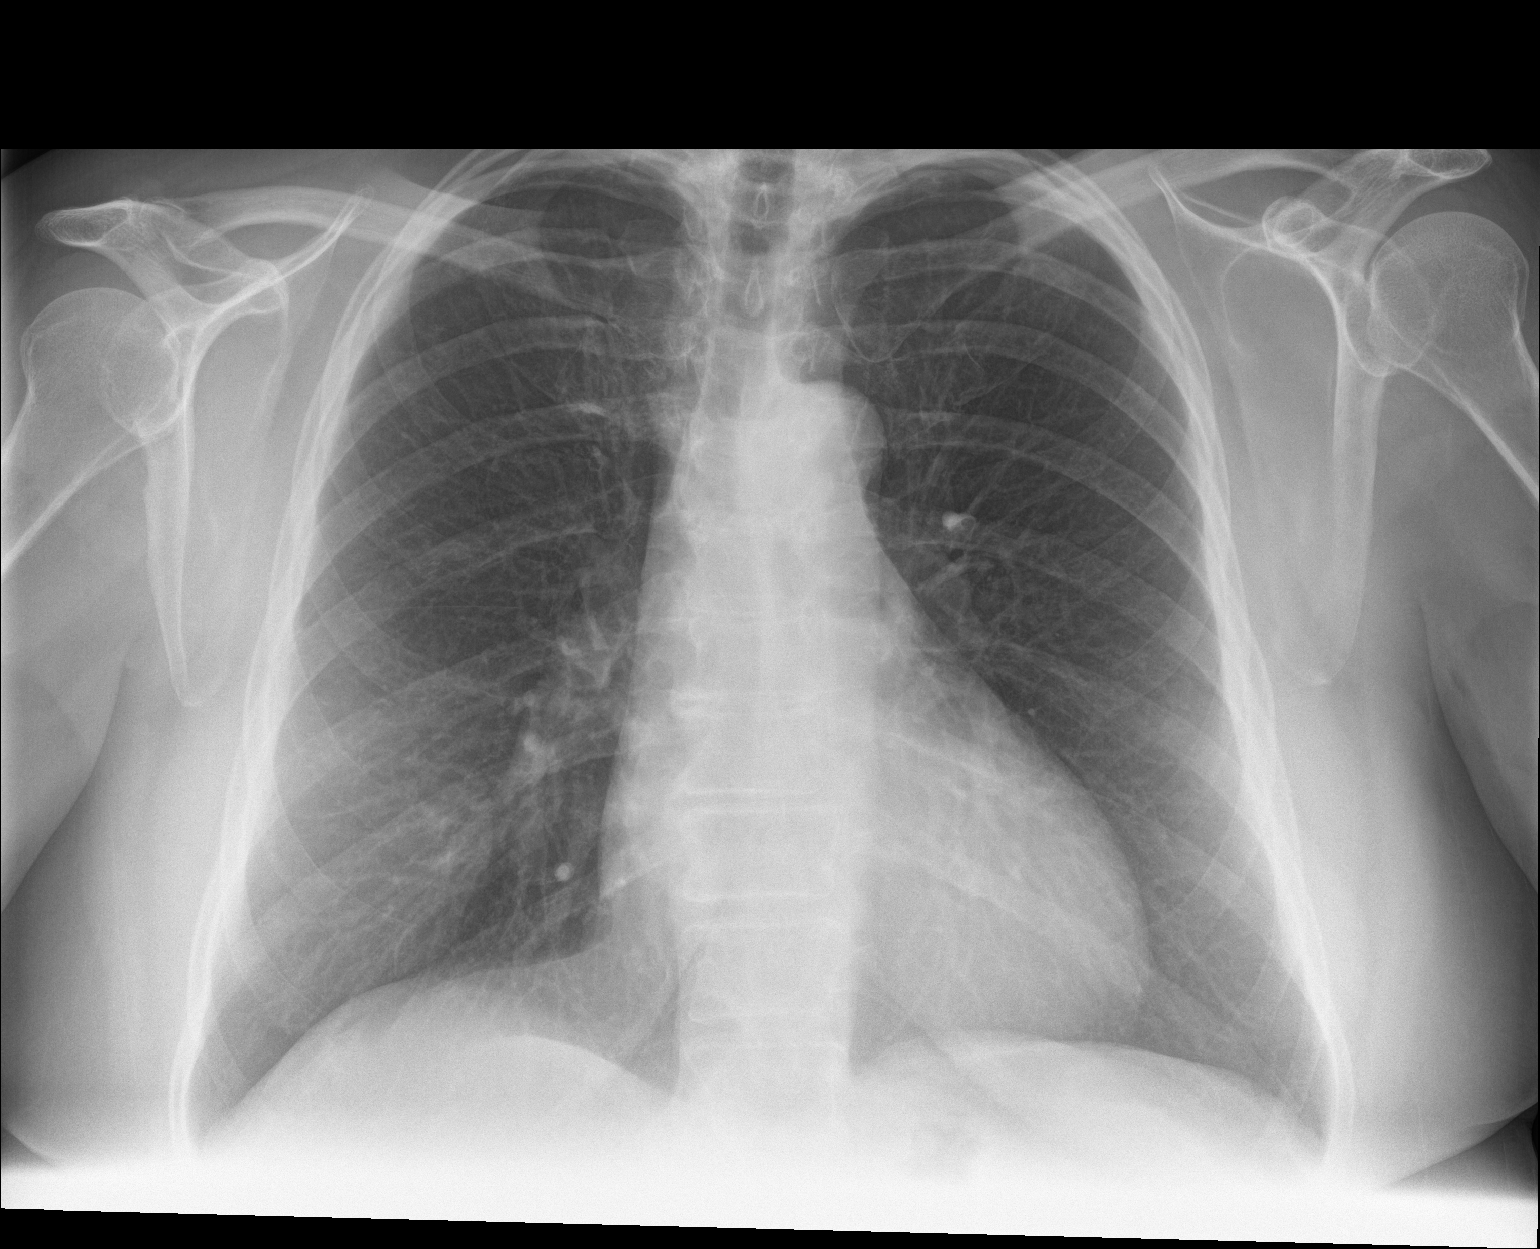

[chest lat]
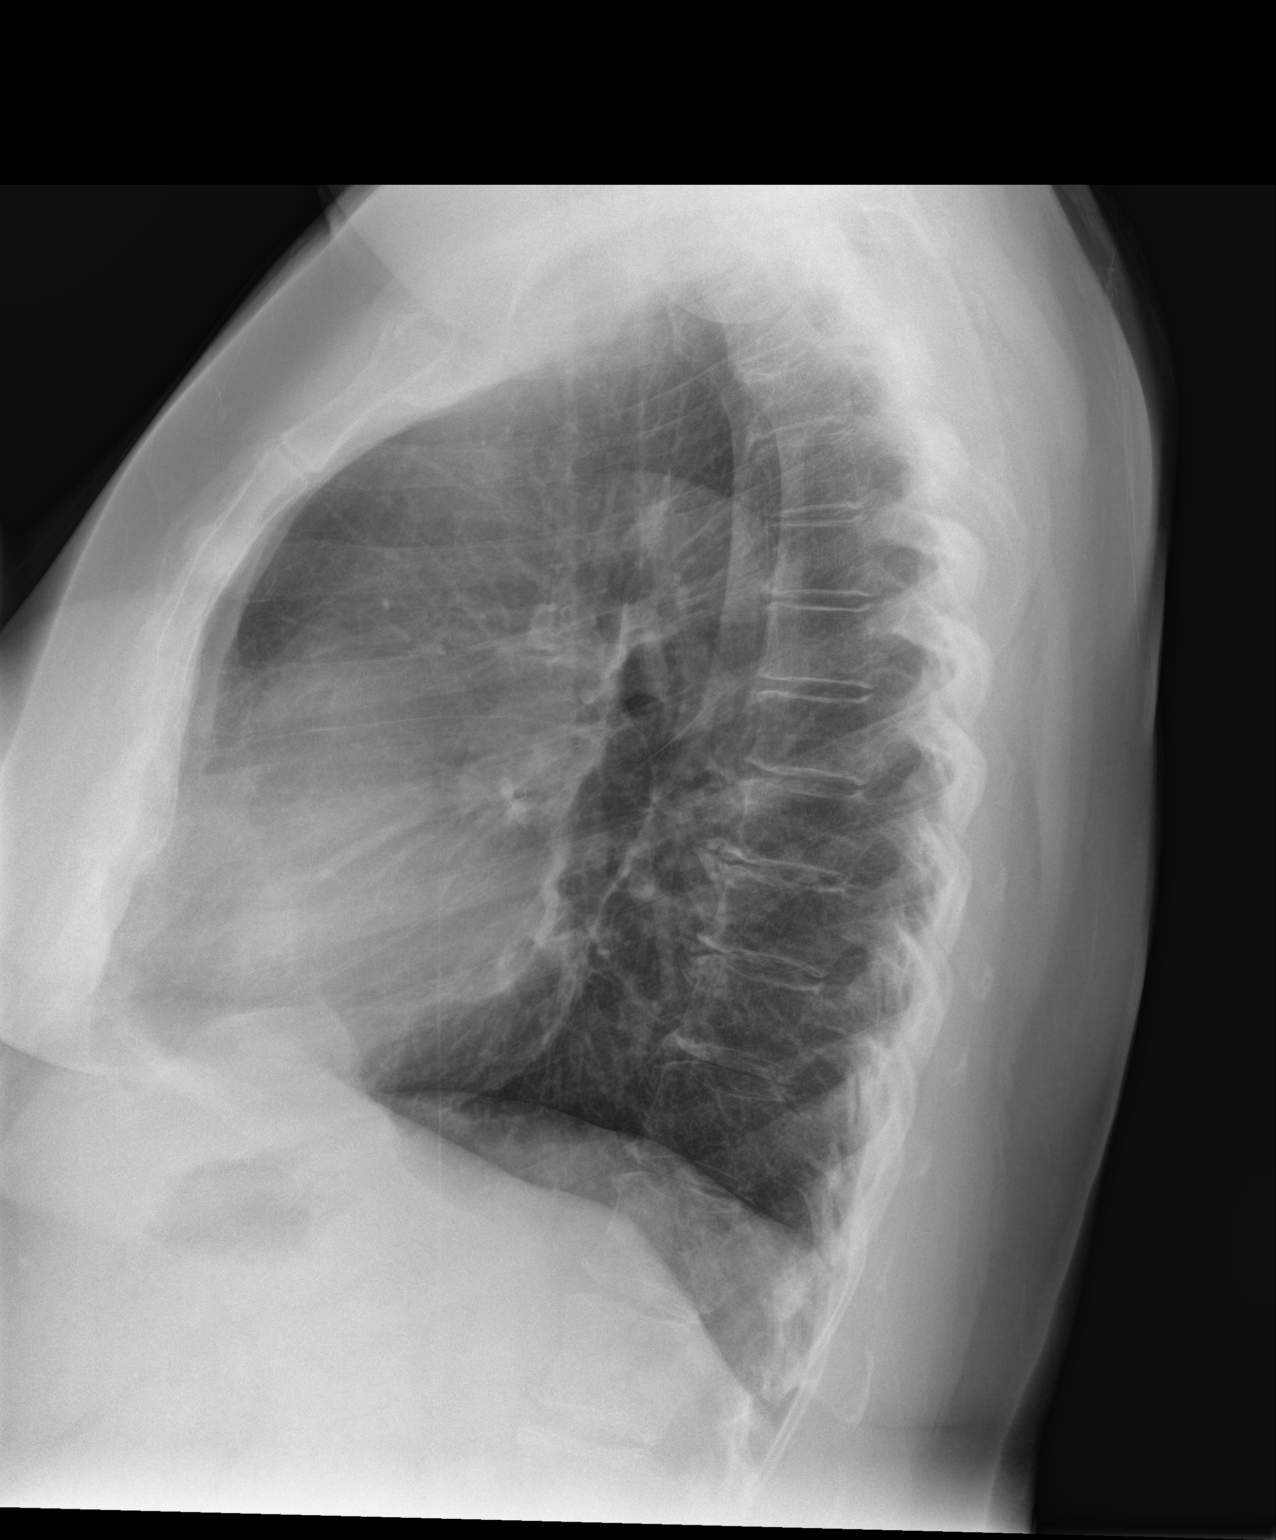

[2 of 2 positions shown; findings below may reference images not displayed]

FINDINGS: Cardiomediastinal silhouette is normal. Mediastinal contours appear
intact.

There is no evidence of focal airspace consolidation, pleural
effusion or pneumothorax.

Osseous structures are without acute abnormality. Soft tissues are
grossly normal.
IMPRESSION: No active cardiopulmonary disease.

## 2020-10-08 ENCOUNTER — Telehealth: Payer: Self-pay | Admitting: Family Medicine

## 2020-10-08 NOTE — Telephone Encounter (Signed)
Copied from [place]. Topic: Medicare AWV >> Oct 08, 2020 11:58 AM Cher Nakai R wrote: Reason for CRM: Left message for patient to call back and schedule Medicare Annual Wellness Visit (AWV) in office.   If unable to come into the office for AWV,  please offer to do virtually or by telephone.  Last AWV: 04/28/2020  Please schedule at anytime with [place].  40 minute appointment  Any questions, please contact me at [phone]

## 2020-10-21 ENCOUNTER — Telehealth: Payer: Self-pay | Admitting: Family Medicine

## 2020-10-21 NOTE — Telephone Encounter (Signed)
Copied from [place] [id number]. Topic: Medicare AWV >> Oct 21, 2020 10:30 AM Cher Nakai R wrote: Reason for CRM:  Left message for patient to call back and schedule Medicare Annual Wellness Visit (AWV) in office.   If unable to come into the office for AWV,  please offer to do virtually or by telephone.  Last AWV:  04/28/2019  Please schedule at anytime with [place].  40 minute appointment  Any questions, please contact me at [phone]

## 2020-10-22 ENCOUNTER — Telehealth: Payer: Self-pay

## 2020-10-22 NOTE — Telephone Encounter (Signed)
Copied from [place] [id number]. Topic: General - Other >> Oct 22, 2020 12:24 PM Celene Kras wrote: Reason for CRM: Pt called stating that she would like to have more info regarding her AWV and what happens during that appt. Please advise.

## 2020-10-24 ENCOUNTER — Ambulatory Visit: Payer: Medicare Other

## 2020-10-31 ENCOUNTER — Ambulatory Visit (INDEPENDENT_AMBULATORY_CARE_PROVIDER_SITE_OTHER): Payer: Medicare Other

## 2020-10-31 DIAGNOSIS — Z0000 Encounter for general adult medical examination without abnormal findings: Secondary | ICD-10-CM

## 2020-10-31 NOTE — Progress Notes (Signed)
Subjective:   Angela Ponce is a 70 y.o. female who presents for Medicare Annual (Subsequent) preventive examination.  Virtual Visit via Telephone Note  I connected with  [facility] on 10/31/20 at  8:40 AM EDT by telephone and verified that I am speaking with the correct person using two identifiers.  Location: Patient: home Provider: [place] Persons participating in the virtual visit: [place]   I discussed the limitations, risks, security and privacy concerns of performing an evaluation and management service by telephone and the availability of in person appointments. The patient expressed understanding and agreed to proceed.  Interactive audio and video telecommunications were attempted between this nurse and patient, however failed, due to patient having technical difficulties OR patient did not have access to video capability.  We continued and completed visit with audio only.  Some vital signs may be absent or patient reported.   Clemetine Marker, LPN   Review of Systems     Cardiac Risk Factors include: advanced age (>71men, >38 women)     Objective:    There were no vitals filed for this visit. There is no height or weight on file to calculate BMI.  Advanced Directives 10/31/2020 04/28/2019 08/25/2018 04/15/2018  Does Patient Have a Medical Advance Directive? No No No No  Would patient like information on creating a medical advance directive? Yes (MAU/Ambulatory/Procedural Areas - Information given) Yes (MAU/Ambulatory/Procedural Areas - Information given) Yes (MAU/Ambulatory/Procedural Areas - Information given) -    Current Medications (verified) No outpatient encounter medications on file as of 10/31/2020.   No facility-administered encounter medications on file as of 10/31/2020.    Allergies (verified) Patient has no known allergies.   History: Past Medical History:  Diagnosis Date   Medical history non-contributory    No known health  problems    Past Surgical History:  Procedure Laterality Date   AUGMENTATION MAMMAPLASTY Bilateral 1979   BREAST SURGERY     breast implants   COLONOSCOPY WITH PROPOFOL N/A 08/25/2018   Procedure: COLONOSCOPY WITH BIOPSY;  Surgeon: Lucilla Lame, MD;  Location: [place];  Service: Endoscopy;  Laterality: N/A;   POLYPECTOMY N/A 08/25/2018   Procedure: POLYPECTOMY;  Surgeon: Lucilla Lame, MD;  Location: [place];  Service: Endoscopy;  Laterality: N/A;  2 Clips placed at site of Ascending Polyp removal   Family History  Problem Relation Age of Onset   Bladder Cancer Mother    Diabetes Mother    Hypertension Mother    Social History   Socioeconomic History   Marital status: Widowed    Spouse name: Not on file   Number of children: 2   Years of education: Not on file   Highest education level: Not on file  Occupational History   Not on file  Tobacco Use   Smoking status: Never   Smokeless tobacco: Never  Vaping Use   Vaping Use: Never used  Substance and Sexual Activity   Alcohol use: No   Drug use: No   Sexual activity: Not Currently    Partners: Male  Other Topics Concern   Not on file  Social History Narrative   Pt lives alone   Social Determinants of Health   Financial Resource Strain: Low Risk    Difficulty of Paying Living Expenses: Not hard at all  Food Insecurity: No Food Insecurity   Worried About Charity fundraiser in the Last Year: Never true   [place] in the Last Year:  Never true  Transportation Needs: No Transportation Needs   Lack of Transportation (Medical): No   Lack of Transportation (Non-Medical): No  Physical Activity: Inactive   Days of Exercise per Week: 0 days   Minutes of Exercise per Session: 0 min  Stress: No Stress Concern Present   Feeling of Stress : Not at all  Social Connections: Moderately Integrated   Frequency of Communication with Friends and Family: More than three times a week   Frequency of Social  Gatherings with Friends and Family: More than three times a week   Attends Religious Services: More than 4 times per year   Active Member of [employer] or Organizations: Yes   Attends Archivist Meetings: More than 4 times per year   Marital Status: Widowed    Tobacco Counseling Counseling given: Not Answered   Clinical Intake:  Pre-visit preparation completed: Yes  Pain : No/denies pain     Nutritional Risks: None Diabetes: No  How often do you need to have someone help you when you read instructions, pamphlets, or other written materials from your doctor or pharmacy?: 1 - Never    Interpreter Needed?: No  Information entered by :: Clemetine Marker LPN   Activities of Daily Living In your present state of health, do you have any difficulty performing the following activities: 10/31/2020 08/27/2020  Hearing? N N  Vision? N N  Difficulty concentrating or making decisions? N N  Walking or climbing stairs? N N  Dressing or bathing? N N  Doing errands, shopping? N N  Preparing Food and eating ? N -  Using the Toilet? N -  In the past six months, have you accidently leaked urine? N -  Do you have problems with loss of bowel control? N -  Managing your Medications? N -  Managing your Finances? N -  Housekeeping or managing your Housekeeping? N -  Some recent data might be hidden    Patient Care Team: Delsa Grana, PA-C as PCP - General (Family Medicine)  Indicate any recent Medical Services you may have received from other than [facility] providers in the past year (date may be approximate).     Assessment:   This is a routine wellness examination for [place].  Hearing/Vision screen Hearing Screening - Comments:: Pt denies hearing difficulty Vision Screening - Comments:: [facility] screen[facility] at [facility]; due for exam  Dietary issues and exercise activities discussed: Current Exercise Habits: The patient does not participate in regular exercise at present,  Exercise limited by: None identified   Goals Addressed             This Visit's Progress    Increase physical activity       Recommend increasing physical activity to at least 3 days per week.        Depression Screen PHQ 2/9 Scores 10/31/2020 08/27/2020 04/28/2019 08/16/2018  PHQ - 2 Score 0 0 0 0  PHQ- 9 Score - 0 - 0    Fall Risk Fall Risk  10/31/2020 08/27/2020 04/28/2019 08/16/2018  Falls in the past year? 0 0 0 0  Number falls in past yr: 0 0 0 0  Injury with Fall? 0 0 0 0  Risk for fall due to : No Fall Risks - No Fall Risks -  Follow up Falls prevention discussed - Falls prevention discussed Falls evaluation completed    FALL RISK PREVENTION PERTAINING TO THE HOME:  Any stairs in or around the home? No  If  so, are there any without handrails? No  Home free of loose throw rugs in walkways, pet beds, electrical cords, etc? Yes  Adequate lighting in your home to reduce risk of falls? Yes   ASSISTIVE DEVICES UTILIZED TO PREVENT FALLS:  Life alert? No  Use of a cane, walker or w/c? No  Grab bars in the bathroom? No  Shower chair or bench in shower? Yes  Elevated toilet seat or a handicapped toilet? No   TIMED UP AND GO:  Was the test performed? No . Telephonic visit.   Cognitive Function: Normal cognitive status assessed by direct observation by this Nurse Health Advisor. No abnormalities found.          Immunizations Immunization History  Administered Date(s) Administered   Tdap 03/19/2016    TDAP status: Up to date  Flu Vaccine status: Declined, Education has been provided regarding the importance of this vaccine but patient still declined. Advised may receive this vaccine at local pharmacy or Health Dept. Aware to provide a copy of the vaccination record if obtained from local pharmacy or Health Dept. Verbalized acceptance and understanding.  Pneumococcal vaccine status: Declined,  Education has been provided regarding the importance of this vaccine but  patient still declined. Advised may receive this vaccine at local pharmacy or Health Dept. Aware to provide a copy of the vaccination record if obtained from local pharmacy or Health Dept. Verbalized acceptance and understanding.   Covid-19 vaccine status: Declined, Education has been provided regarding the importance of this vaccine but patient still declined. Advised may receive this vaccine at local pharmacy or Health Dept.or vaccine clinic. Aware to provide a copy of the vaccination record if obtained from local pharmacy or Health Dept. Verbalized acceptance and understanding.  Qualifies for Shingles Vaccine? Yes   Zostavax completed No   Shingrix Completed?: No.    Education has been provided regarding the importance of this vaccine. Patient has been advised to call insurance company to determine out of pocket expense if they have not yet received this vaccine. Advised may also receive vaccine at local pharmacy or Health Dept. Verbalized acceptance and understanding.  Screening Tests Health Maintenance  Topic Date Due   COVID-19 Vaccine (1) Never done   Zoster Vaccines- Shingrix (1 of 2) Never done   MAMMOGRAM  09/26/2019   DEXA SCAN  08/15/2020   INFLUENZA VACCINE  05/09/2021 (Originally 09/09/2020)   COLONOSCOPY (Pts 45-42yrs Insurance coverage will need to be confirmed)  08/24/2021   TETANUS/TDAP  03/19/2026   Hepatitis C Screening  Completed   HPV VACCINES  Aged Out    Health Maintenance  Health Maintenance Due  Topic Date Due   COVID-19 Vaccine (1) Never done   Zoster Vaccines- Shingrix (1 of 2) Never done   MAMMOGRAM  09/26/2019   DEXA SCAN  08/15/2020    Colorectal cancer screening: Type of screening: Colonoscopy. Completed 08/16/18. Repeat every 3 years  Mammogram status: Completed 09/26/18. Repeat every year. Ordered 08/27/20  Bone density status: ordered 08/27/20  Lung Cancer Screening: (Low Dose CT Chest recommended if Age 82-80 years, 30 pack-year currently smoking OR  have quit w/in 15years.) does not qualify.   Additional Screening:  Hepatitis C Screening: does qualify; Completed 08/16/18  Vision Screening: Recommended annual ophthalmology exams for early detection of glaucoma and other disorders of the eye. Is the patient up to date with their annual eye exam?  No  Who is the provider or what is the name of the office in which  the patient attends annual eye exams? [place] Screening: Recommended annual dental exams for proper oral hygiene  Community Resource Referral / Chronic Care Management: CRR required this visit?  No   CCM required this visit?  No      Plan:     I have personally reviewed and noted the following in the patient's chart:   Medical and social history Use of alcohol, tobacco or illicit drugs  Current medications and supplements including opioid prescriptions.  Functional ability and status Nutritional status Physical activity Advanced directives List of other physicians Hospitalizations, surgeries, and ER visits in previous 12 months Vitals Screenings to include cognitive, depression, and falls Referrals and appointments  In addition, I have reviewed and discussed with patient certain preventive protocols, quality metrics, and best practice recommendations. A written personalized care plan for preventive services as well as general preventive health recommendations were provided to patient.     Clemetine Marker, LPN   05/18/8117   Nurse Notes: none

## 2020-10-31 NOTE — Patient Instructions (Signed)
Angela Ponce , Thank you for taking time to come for your Medicare Wellness Visit. I appreciate your ongoing commitment to your health goals. Please review the following plan we discussed and let me know if I can assist you in the future.   Screening recommendations/referrals: Colonoscopy: done 08/16/18; repeat 08/2021 Mammogram: done 08/16/18. Please call [phone] to schedule your mammogram and bone density screening.  Bone Density: due Recommended yearly ophthalmology/optometry visit for glaucoma screening and checkup Recommended yearly dental visit for hygiene and checkup  Vaccinations: Influenza vaccine: declined Pneumococcal vaccine: declined Tdap vaccine: done 03/19/16 Shingles vaccine: Shingrix discussed. Please contact your pharmacy for coverage information.  Covid-19:declined  Advanced directives: Advance directive discussed with you today. I have provided a copy for you to complete at home and have notarized. Once this is complete please bring a copy in to our office so we can scan it into your chart.   Conditions/risks identified: Recommend increasing physical activity to at least 3 days per week   Next appointment: Follow up in one year for your annual wellness visit    Preventive Care 65 Years and Older, Female Preventive care refers to lifestyle choices and visits with your health care provider that can promote health and wellness. What does preventive care include? A yearly physical exam. This is also called an annual well check. Dental exams once or twice a year. Routine eye exams. Ask your health care provider how often you should have your eyes checked. Personal lifestyle choices, including: Daily care of your teeth and gums. Regular physical activity. Eating a healthy diet. Avoiding tobacco and drug use. Limiting alcohol use. Practicing safe sex. Taking low-dose aspirin every day. Taking vitamin and mineral supplements as recommended by your health care  provider. What happens during an annual well check? The services and screenings done by your health care provider during your annual well check will depend on your age, overall health, lifestyle risk factors, and family history of disease. Counseling  Your health care provider may ask you questions about your: Alcohol use. Tobacco use. Drug use. Emotional well-being. Home and relationship well-being. Sexual activity. Eating habits. History of falls. Memory and ability to understand (cognition). Work and work Statistician. Reproductive health. Screening  You may have the following tests or measurements: Height, weight, and BMI. Blood pressure. Lipid and cholesterol levels. These may be checked every 5 years, or more frequently if you are over 18 years old. Skin check. Lung cancer screening. You may have this screening every year starting at age 60 if you have a 30-pack-year history of smoking and currently smoke or have quit within the past 15 years. Fecal occult blood test (FOBT) of the stool. You may have this test every year starting at age 42. Flexible sigmoidoscopy or colonoscopy. You may have a sigmoidoscopy every 5 years or a colonoscopy every 10 years starting at age 1. Hepatitis C blood test. Hepatitis B blood test. Sexually transmitted disease (STD) testing. Diabetes screening. This is done by checking your blood sugar (glucose) after you have not eaten for a while (fasting). You may have this done every 1-3 years. Bone density scan. This is done to screen for osteoporosis. You may have this done starting at age 33. Mammogram. This may be done every 1-2 years. Talk to your health care provider about how often you should have regular mammograms. Talk with your health care provider about your test results, treatment options, and if necessary, the need for more tests. Vaccines  Your health care  provider may recommend certain vaccines, such as: Influenza vaccine. This is  recommended every year. Tetanus, diphtheria, and acellular pertussis (Tdap, Td) vaccine. You may need a Td booster every 10 years. Zoster vaccine. You may need this after age 24. Pneumococcal 13-valent conjugate (PCV13) vaccine. One dose is recommended after age 18. Pneumococcal polysaccharide (PPSV23) vaccine. One dose is recommended after age 61. Talk to your health care provider about which screenings and vaccines you need and how often you need them. This information is not intended to replace advice given to you by your health care provider. Make sure you discuss any questions you have with your health care provider. Document Released: 02/22/2015 Document Revised: 10/16/2015 Document Reviewed: 11/27/2014 Elsevier Interactive Patient Education  2017 [employer] Prevention in the Home Falls can cause injuries. They can happen to people of all ages. There are many things you can do to make your home safe and to help prevent falls. What can I do on the outside of my home? Regularly fix the edges of walkways and driveways and fix any cracks. Remove anything that might make you trip as you walk through a door, such as a raised step or threshold. Trim any bushes or trees on the path to your home. Use bright outdoor lighting. Clear any walking paths of anything that might make someone trip, such as rocks or tools. Regularly check to see if handrails are loose or broken. Make sure that both sides of any steps have handrails. Any raised decks and porches should have guardrails on the edges. Have any leaves, snow, or ice cleared regularly. Use sand or salt on walking paths during winter. Clean up any spills in your garage right away. This includes oil or grease spills. What can I do in the bathroom? Use night lights. Install grab bars by the toilet and in the tub and shower. Do not use towel bars as grab bars. Use non-skid mats or decals in the tub or shower. If you need to sit down in  the shower, use a plastic, non-slip stool. Keep the floor dry. Clean up any water that spills on the floor as soon as it happens. Remove soap buildup in the tub or shower regularly. Attach bath mats securely with double-sided non-slip rug tape. Do not have throw rugs and other things on the floor that can make you trip. What can I do in the bedroom? Use night lights. Make sure that you have a light by your bed that is easy to reach. Do not use any sheets or blankets that are too big for your bed. They should not hang down onto the floor. Have a firm chair that has side arms. You can use this for support while you get dressed. Do not have throw rugs and other things on the floor that can make you trip. What can I do in the kitchen? Clean up any spills right away. Avoid walking on wet floors. Keep items that you use a lot in easy-to-reach places. If you need to reach something above you, use a strong step stool that has a grab bar. Keep electrical cords out of the way. Do not use floor polish or wax that makes floors slippery. If you must use wax, use non-skid floor wax. Do not have throw rugs and other things on the floor that can make you trip. What can I do with my stairs? Do not leave any items on the stairs. Make sure that there are handrails on both  sides of the stairs and use them. Fix handrails that are broken or loose. Make sure that handrails are as long as the stairways. Check any carpeting to make sure that it is firmly attached to the stairs. Fix any carpet that is loose or worn. Avoid having throw rugs at the top or bottom of the stairs. If you do have throw rugs, attach them to the floor with carpet tape. Make sure that you have a light switch at the top of the stairs and the bottom of the stairs. If you do not have them, ask someone to add them for you. What else can I do to help prevent falls? Wear shoes that: Do not have high heels. Have rubber bottoms. Are comfortable  and fit you well. Are closed at the toe. Do not wear sandals. If you use a stepladder: Make sure that it is fully opened. Do not climb a closed stepladder. Make sure that both sides of the stepladder are locked into place. Ask someone to hold it for you, if possible. Clearly mark and make sure that you can see: Any grab bars or handrails. First and last steps. Where the edge of each step is. Use tools that help you move around (mobility aids) if they are needed. These include: Canes. Walkers. Scooters. Crutches. Turn on the lights when you go into a dark area. Replace any light bulbs as soon as they burn out. Set up your furniture so you have a clear path. Avoid moving your furniture around. If any of your floors are uneven, fix them. If there are any pets around you, be aware of where they are. Review your medicines with your doctor. Some medicines can make you feel dizzy. This can increase your chance of falling. Ask your doctor what other things that you can do to help prevent falls. This information is not intended to replace advice given to you by your health care provider. Make sure you discuss any questions you have with your health care provider. Document Released: 11/22/2008 Document Revised: 07/04/2015 Document Reviewed: 03/02/2014 Elsevier Interactive Patient Education  2017 [employer].

## 2021-01-05 ENCOUNTER — Other Ambulatory Visit: Payer: Self-pay

## 2021-01-05 ENCOUNTER — Ambulatory Visit
Admission: EM | Admit: 2021-01-05 | Discharge: 2021-01-05 | Disposition: A | Discharge status: Home / Self Care | Payer: Medicare Other | Attending: Emergency Medicine | Admitting: Emergency Medicine

## 2021-01-05 ENCOUNTER — Encounter: Payer: Self-pay | Admitting: Emergency Medicine

## 2021-01-05 DIAGNOSIS — J069 Acute upper respiratory infection, unspecified: Secondary | ICD-10-CM

## 2021-01-05 MED ORDER — DOXYCYCLINE HYCLATE 100 MG PO CAPS: 100.0000 mg | ORAL_CAPSULE | Freq: Two times a day (BID) | ORAL | 0 refills | Status: AC

## 2021-01-05 MED ORDER — BENZONATATE 100 MG PO CAPS: 100.0000 mg | ORAL_CAPSULE | Freq: Three times a day (TID) | ORAL | 0 refills | Status: AC

## 2021-01-05 NOTE — ED Triage Notes (Signed)
Patient c/o cough and chest congestion for over a week.  Patient denies fevers.

## 2021-01-05 NOTE — ED Provider Notes (Signed)
[facility]    CSN: [id number] Arrival date & time: 01/05/21  1000      History   Chief Complaint Chief Complaint  Patient presents with   Cough    HPI Angela Ponce is a 69 y.o. female.   Angela Ponce, 7-year-old female, presents to urgent care chief complaint of cough and congestion for over a week.  The history is provided by the patient. No language interpreter was used.   Past Medical History:  Diagnosis Date   Medical history non-contributory    No known health problems     Patient Active Problem List   Diagnosis Date Noted   Upper respiratory tract infection 01/05/2021   Encounter for screening colonoscopy    Polyp of ascending colon    Rectal polyp     Past Surgical History:  Procedure Laterality Date   AUGMENTATION MAMMAPLASTY Bilateral 1979   BREAST SURGERY     breast implants   COLONOSCOPY WITH PROPOFOL N/A 08/25/2018   Procedure: COLONOSCOPY WITH BIOPSY;  Surgeon: Lucilla Lame, MD;  Location: [place];  Service: Endoscopy;  Laterality: N/A;   POLYPECTOMY N/A 08/25/2018   Procedure: POLYPECTOMY;  Surgeon: Lucilla Lame, MD;  Location: [place];  Service: Endoscopy;  Laterality: N/A;  2 Clips placed at site of Ascending Polyp removal    OB History   No obstetric history on file.      Home Medications    Prior to Admission medications   Medication Sig Start Date End Date Taking? Authorizing Provider  benzonatate (TESSALON) 100 MG capsule Take 1 capsule (100 mg total) by mouth every 8 (eight) hours. 10/62/69  Yes Jeran Hiltz, Jeanett Schlein, NP  doxycycline (VIBRAMYCIN) 100 MG capsule Take 1 capsule (100 mg total) by mouth 2 (two) times daily. 48/54/62  Yes Marlane Hirschmann, Jeanett Schlein, NP    Family History Family History  Problem Relation Age of Onset   Bladder Cancer Mother    Diabetes Mother    Hypertension Mother     Social History Social History   Tobacco Use   Smoking status: Never   Smokeless tobacco: Never  Vaping  Use   Vaping Use: Never used  Substance Use Topics   Alcohol use: No   Drug use: No     Allergies   Patient has no known allergies.   Review of Systems Review of Systems  Constitutional:  Negative for fever.  HENT:  Positive for congestion.   Respiratory:  Positive for cough. Negative for wheezing.   All other systems reviewed and are negative.   Physical Exam Triage Vital Signs ED Triage Vitals  Enc Vitals Group     BP 01/05/21 1017 (!) 152/89     Pulse Rate 01/05/21 1017 75     Resp 01/05/21 1017 14     Temp 01/05/21 1017 98 F (36.7 C)     Temp Source 01/05/21 1017 Oral     SpO2 01/05/21 1017 96 %     Weight 01/05/21 1015 180 lb (81.6 kg)     Height 01/05/21 1015 5' 5.5" (1.664 m)     Head Circumference --      Peak Flow --      Pain Score 01/05/21 1015 0     Pain Loc --      Pain Edu? --      Excl. in [place]? --    No data found.  Updated Vital Signs BP (!) 152/89 (BP Location: Left Arm)   Pulse 75  Temp 98 F (36.7 C) (Oral)   Resp 14   Ht 5' 5.5" (1.664 m)   Wt 180 lb (81.6 kg)   SpO2 96%   BMI 29.50 kg/m   Visual Acuity Right Eye Distance:   Left Eye Distance:   Bilateral Distance:    Right Eye Near:   Left Eye Near:    Bilateral Near:     Physical Exam Vitals and nursing note reviewed.  Constitutional:      General: She is not in acute distress.    Appearance: She is well-developed.  HENT:     Head: Normocephalic.     Right Ear: Tympanic membrane is retracted.     Left Ear: Tympanic membrane is retracted.     Nose: Congestion present.     Mouth/Throat:     Lips: Pink.     Mouth: Mucous membranes are moist.     Pharynx: Oropharynx is clear.  Eyes:     General: Lids are normal.     Conjunctiva/sclera: Conjunctivae normal.     Pupils: Pupils are equal, round, and reactive to light.  Neck:     Trachea: No tracheal deviation.  Cardiovascular:     Rate and Rhythm: Normal rate and regular rhythm.     Pulses: Normal pulses.     Heart  sounds: Normal heart sounds. No murmur heard. Pulmonary:     Effort: Pulmonary effort is normal.     Breath sounds: Normal breath sounds and air entry.  Abdominal:     General: Bowel sounds are normal.     Palpations: Abdomen is soft.     Tenderness: There is no abdominal tenderness.  Musculoskeletal:        General: Normal range of motion.     Cervical back: Normal range of motion.  Lymphadenopathy:     Cervical: No cervical adenopathy.  Skin:    General: Skin is warm and dry.     Findings: No rash.  Neurological:     General: No focal deficit present.     Mental Status: She is alert and oriented to person, place, and time.     GCS: GCS eye subscore is 4. GCS verbal subscore is 5. GCS motor subscore is 6.  Psychiatric:        Attention and Perception: Attention normal.        Mood and Affect: Mood normal.        Speech: Speech normal.        Behavior: Behavior normal. Behavior is cooperative.     UC Treatments / Results  Labs (all labs ordered are listed, but only abnormal results are displayed) Labs Reviewed - No data to display  EKG   Radiology No results found.  Procedures Procedures (including critical care time)  Medications Ordered in UC Medications - No data to display  Initial Impression / Assessment and Plan / UC Course  I have reviewed the triage vital signs and the nursing notes.  Pertinent labs & imaging results that were available during my care of the patient were reviewed by me and considered in my medical decision making (see chart for details).     Ddx: URI, pneumonia, sinusitis Final Clinical Impressions(s) / UC Diagnoses   Final diagnoses:  Upper respiratory tract infection, unspecified type     Discharge Instructions      Rest,push fluids,take antibiotic as directed. Follow up with PCP.     ED Prescriptions     Medication Sig Dispense Auth. Provider  doxycycline (VIBRAMYCIN) 100 MG capsule Take 1 capsule (100 mg total) by  mouth 2 (two) times daily. 20 capsule Makenze Ellett, NP   benzonatate (TESSALON) 100 MG capsule Take 1 capsule (100 mg total) by mouth every 8 (eight) hours. 21 capsule Jaziel Bennett, Jeanett Schlein, NP      PDMP not reviewed this encounter.   Tori Milks, NP 88/82/80 1255

## 2021-01-05 NOTE — Discharge Instructions (Addendum)
Rest,push fluids,take antibiotic as directed. Follow up with PCP.  

## 2021-11-04 ENCOUNTER — Ambulatory Visit (INDEPENDENT_AMBULATORY_CARE_PROVIDER_SITE_OTHER): Payer: Medicare Other

## 2021-11-04 DIAGNOSIS — Z0000 Encounter for general adult medical examination without abnormal findings: Secondary | ICD-10-CM

## 2021-11-04 NOTE — Progress Notes (Signed)
Unable to complete visit, no answer.
